# Patient Record
Sex: Male | Born: 1960 | Race: White | Hispanic: No | Marital: Married | Smoking: Never smoker
Health system: Southern US, Community
[De-identification: ages and names within clinical notes are randomized; demographics above are authoritative.]

## PROBLEM LIST (undated history)

## (undated) DIAGNOSIS — M1990 Unspecified osteoarthritis, unspecified site: Secondary | ICD-10-CM

## (undated) DIAGNOSIS — M549 Dorsalgia, unspecified: Secondary | ICD-10-CM

## (undated) DIAGNOSIS — T7840XA Allergy, unspecified, initial encounter: Secondary | ICD-10-CM

## (undated) DIAGNOSIS — K219 Gastro-esophageal reflux disease without esophagitis: Secondary | ICD-10-CM

## (undated) DIAGNOSIS — N200 Calculus of kidney: Secondary | ICD-10-CM

## (undated) HISTORY — DX: Dorsalgia, unspecified: M54.9

## (undated) HISTORY — PX: EYE SURGERY: SHX253

## (undated) HISTORY — DX: Gastro-esophageal reflux disease without esophagitis: K21.9

## (undated) HISTORY — DX: Unspecified osteoarthritis, unspecified site: M19.90

## (undated) HISTORY — DX: Allergy, unspecified, initial encounter: T78.40XA

## (undated) HISTORY — PX: HERNIA REPAIR: SHX51

---

## 2005-07-21 ENCOUNTER — Emergency Department (HOSPITAL_COMMUNITY): Admission: EM | Admit: 2005-07-21 | Discharge: 2005-07-21 | Payer: Self-pay | Admitting: Emergency Medicine

## 2005-12-25 ENCOUNTER — Ambulatory Visit: Payer: Self-pay | Admitting: Cardiology

## 2005-12-25 ENCOUNTER — Inpatient Hospital Stay (HOSPITAL_COMMUNITY): Admission: EM | Admit: 2005-12-25 | Discharge: 2005-12-28 | Payer: Self-pay | Admitting: Emergency Medicine

## 2005-12-27 ENCOUNTER — Ambulatory Visit: Payer: Self-pay | Admitting: Cardiology

## 2006-06-04 ENCOUNTER — Ambulatory Visit (HOSPITAL_COMMUNITY): Admission: RE | Admit: 2006-06-04 | Discharge: 2006-06-04 | Payer: Self-pay | Admitting: Pulmonary Disease

## 2007-12-31 ENCOUNTER — Emergency Department (HOSPITAL_COMMUNITY): Admission: EM | Admit: 2007-12-31 | Discharge: 2007-12-31 | Payer: Self-pay | Admitting: Emergency Medicine

## 2008-01-05 ENCOUNTER — Ambulatory Visit (HOSPITAL_COMMUNITY): Admission: RE | Admit: 2008-01-05 | Discharge: 2008-01-05 | Payer: Self-pay | Admitting: Pulmonary Disease

## 2008-01-07 ENCOUNTER — Encounter: Admission: RE | Admit: 2008-01-07 | Discharge: 2008-01-07 | Payer: Self-pay | Admitting: Pulmonary Disease

## 2008-01-22 ENCOUNTER — Encounter: Admission: RE | Admit: 2008-01-22 | Discharge: 2008-01-22 | Payer: Self-pay | Admitting: Pulmonary Disease

## 2008-04-23 ENCOUNTER — Emergency Department (HOSPITAL_COMMUNITY): Admission: EM | Admit: 2008-04-23 | Discharge: 2008-04-23 | Payer: Self-pay | Admitting: Emergency Medicine

## 2008-05-26 ENCOUNTER — Ambulatory Visit (HOSPITAL_COMMUNITY): Admission: RE | Admit: 2008-05-26 | Discharge: 2008-05-26 | Payer: Self-pay | Admitting: Pulmonary Disease

## 2008-08-04 ENCOUNTER — Encounter: Admission: RE | Admit: 2008-08-04 | Discharge: 2008-08-04 | Payer: Self-pay | Admitting: Pulmonary Disease

## 2008-11-25 ENCOUNTER — Encounter: Admission: RE | Admit: 2008-11-25 | Discharge: 2008-11-25 | Payer: Self-pay | Admitting: Pulmonary Disease

## 2009-04-14 ENCOUNTER — Emergency Department (HOSPITAL_COMMUNITY): Admission: EM | Admit: 2009-04-14 | Discharge: 2009-04-14 | Payer: Self-pay | Admitting: Emergency Medicine

## 2010-07-23 LAB — CBC
MCV: 90.5 fL (ref 78.0–100.0)
RBC: 4.8 MIL/uL (ref 4.22–5.81)
WBC: 8.1 10*3/uL (ref 4.0–10.5)

## 2010-07-23 LAB — DIFFERENTIAL
Basophils Absolute: 0.1 10*3/uL (ref 0.0–0.1)
Lymphs Abs: 1.7 10*3/uL (ref 0.7–4.0)
Monocytes Relative: 7 % (ref 3–12)
Neutro Abs: 5.1 10*3/uL (ref 1.7–7.7)

## 2010-07-23 LAB — BASIC METABOLIC PANEL
BUN: 16 mg/dL (ref 6–23)
CO2: 24 mEq/L (ref 19–32)
Chloride: 106 mEq/L (ref 96–112)
GFR calc Af Amer: >60 mL/min (ref >60)
Potassium: 4.1 mEq/L (ref 3.5–5.1)
Sodium: 141 mEq/L (ref 135–145)
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
## 2010-09-07 NOTE — Procedures (Signed)
NAME:  Eugene Kelley, Eugene Kelley                ACCOUNT NO.:  [id number]   MEDICAL RECORD NO.:  [id number]          PATIENT TYPE:  INP   LOCATION:  A208                          FACILITY:  [facility]   PHYSICIAN:  Gerrit Friends. Dietrich Pates, MD, FACCDATE OF BIRTH:  Mar 21, 1961   DATE OF PROCEDURE:  12/25/2005  DATE OF DISCHARGE:                                  ECHOCARDIOGRAM   CLINICAL DATA:  [age]-year-old gentleman admitted to hospital with  chest pain.  M-mode aorta 3.4, left atrium 3.6, septum 1.1, posterior wall  1.0, LV diastole 4.6, LV systole 3.2.  1. Technically suboptimal but adequate echocardiographic study.  2. Normal left atrial size; right atrium and right ventricle are prominent      but not frankly enlarged; no RVH; normal RV systolic function.  3. Aortic root at the upper limit of normal; mild calcification of the      proximal ascending aorta.  4. Normal mitral, tricuspid, aortic and pulmonic valves; normal proximal      pulmonary artery.  5. Normal Doppler study with physiologic tricuspid regurgitation and      normal estimated RV systolic pressure.  6. Normal internal dimension, wall thickness, regional and global function      of the left ventricle.      Gerrit Friends. Dietrich Pates, MD, [facility]  Electronically Signed     RMR/MEDQ  D:  12/25/2005  T:  12/26/2005  Job:  [id number]

## 2010-09-07 NOTE — Cardiovascular Report (Signed)
NAME:  Eugene Kelley, Eugene Kelley                ACCOUNT NO.:  [id number]   MEDICAL RECORD NO.:  [id number]          PATIENT TYPE:  OUT   LOCATION:  CATH                         FACILITY:  [facility]   PHYSICIAN:  Arturo Morton. 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  He presented with substernal chest pain.  He was admitted.  D-dimer  was within the normal range.  There were no definite EKG changes, and  cardiac enzymes were negative.  A stress test did not reveal significant ST  segment changes, but did suggest anteroseptal ischemia, and he was  subsequently sent down for cardiac catheterization by Dr. Dietrich Pates.  Dr.  Dietrich Pates explained the risks, benefits and alternatives to the patient, and  he was agreeable to proceed.  I followed with the same similar information.  The patient was agreeable.   PROCEDURE:  1. Left heart catheterization.  2. Selective coronary arteriography.  3. Selective left ventriculography.   DESCRIPTION OF PROCEDURE:  The patient was brought to the catheterization  laboratory and prepped and draped in the usual fashion.  Through an anterior  puncture, the right femoral artery was easily entered.  A 6-French sheath  was then placed.  Views of the left and right coronary arteries were  obtained with multiple angiographic projections.  Specifically,   Dictation Ended At [employer].      Arturo Morton. Riley Kill, MD, [facility]  Electronically Signed     TDS/MEDQ  D:  12/27/2005  T:  12/27/2005  Job:  [id number]   cc:   Kirstie Peri, MD  Gerrit Friends. Dietrich Pates, MD, [facility]  Michaelyn Barter, M.D.

## 2010-09-07 NOTE — Cardiovascular Report (Signed)
NAME:  Eugene Kelley, Eugene Kelley                ACCOUNT NO.:  [id number]   MEDICAL RECORD NO.:  [id number]          PATIENT TYPE:  INP   LOCATION:  2807                         FACILITY:  [facility]   PHYSICIAN:  Arturo Morton. Riley Kill, MD, FACCDATE OF BIRTH:  August 11, 1960   DATE OF PROCEDURE:  12/27/2005  DATE OF DISCHARGE:                              CARDIAC CATHETERIZATION   INDICATIONS:  Eugene Kelley is a 50 year old gentleman who presented with chest  pain.  His enzymes were negative.  His EKGs did not show acute change.  Exercise tolerance test was performed with abnormal exercise tolerance and a  perfusion defect.  Dr. Dietrich Pates recommended cardiac catheterization.  He was  sent to the lab for further evaluation.   PROCEDURE:  1. Left heart catheterization.  2. Selective coronary territory.  3. Selective left ventriculography.   DESCRIPTION OF PROCEDURE:  The patient was brought to the catheterization  laboratory and prepped and draped in usual fashion.  Through an anterior  puncture, the right femoral artery was easily entered, and a 6-French sheath  was placed.  Following this, views of the left coronary arteries were  obtained in multiple angiographic projections.  Particular time was taken to  try to lay out the two diagonal branches which had close exit from the LAD.  The right coronary artery had an anterior takeoff.  Central aortic and left  ventricular pressures were measured with a pigtail.  Ventriculography was  performed in the RAO projection.  There were no complications.  The patient  was taken to the holding area in satisfactory clinical condition.   HEMODYNAMIC DATA.:  1. Central aortic pressure 103/60, mean 78.  2. Left ventricular pressure 112/19.  3. No gradient or pullback across the aortic valve.   ANGIOGRAPHIC DATA:  1. Ventriculography was done in the RAO projection.  There was ventricular      ectopy, so ejection fraction could not be formally calculated, but on a      post  PVC beat, there did not appear to be a definite wall motion      abnormality.  2. The left main is free of critical disease.  3. Left anterior descending artery courses to the apex.  There are two      diagonal branches which come off closely.  The first one occupies the      distribution of a marginal branch in several views.  There does not      appear to be significant ostial narrowing.  The second diagonal branch      is a fairly large caliber vessel without critical narrowing.  The      distal LAD courses to the apex and appears to be smooth.  4. There is a ramus intermedius that bifurcates and is without critical      narrowing.  5. The AV circumflex provides a marginal branch and a small distal AV      circumflex, and these appear to be relatively smooth and without      significant narrowing.  6. The right coronary artery has an anterior takeoff and appears smooth  throughout distally with an acute marginal, posterior descending and      posterolateral branch.   CONCLUSION:  1. Well-preserved left ventricular function.  2. No evidence of critical obstruction.   DISPOSITION:  We will recheck the patient's D-dimer.  We will recommend that  he discontinues using skoal.  The exact etiology is unclear, but at the  present time, conservative course of management will be recommended.      Arturo Morton. Riley Kill, MD, [facility]  Electronically Signed     TDS/MEDQ  D:  12/27/2005  T:  12/27/2005  Job:  [id number]   cc:   Gerrit Friends. Dietrich Pates, MD, [facility]  Kirstie Peri, MD  Michaelyn Barter, M.D.

## 2011-01-23 LAB — URINALYSIS, ROUTINE W REFLEX MICROSCOPIC
Nitrite: NEGATIVE
Protein, ur: NEGATIVE
Urobilinogen, UA: 0.2
pH: 6

## 2012-04-18 ENCOUNTER — Emergency Department (HOSPITAL_COMMUNITY)
Admission: EM | Admit: 2012-04-18 | Discharge: 2012-04-18 | Disposition: A | Discharge status: Home / Self Care | Payer: Managed Care, Other (non HMO) | Source: Home / Self Care

## 2012-04-18 ENCOUNTER — Encounter (HOSPITAL_COMMUNITY): Payer: Self-pay | Admitting: Emergency Medicine

## 2012-04-18 DIAGNOSIS — K529 Noninfective gastroenteritis and colitis, unspecified: Secondary | ICD-10-CM

## 2012-04-18 DIAGNOSIS — K5289 Other specified noninfective gastroenteritis and colitis: Secondary | ICD-10-CM

## 2012-04-18 HISTORY — DX: Calculus of kidney: N20.0

## 2012-04-18 NOTE — ED Notes (Signed)
Pt states that he was exposed to the norovirus. Pt has been having n/v/d since Friday morning. Pt tried otc meds and gingerale with no relief.   Pt also states that he would need a note to return to work.

## 2012-04-18 NOTE — ED Notes (Signed)
Waiting discharge papers 

## 2012-04-18 NOTE — ED Provider Notes (Signed)
History     CSN: [id number]  Arrival date & time 04/18/12  1250   None     Chief Complaint  Patient presents with  . URI    pt exposed to norovirus. n/v/d. symptoms started friday morning.    (Consider location/radiation/quality/duration/timing/severity/associated sxs/prior treatment) HPI Comments: The 51 year old male said nausea vomiting diarrhea yesterday beginning at 2:30 AM and ending 6 AM this morning. Was associated with a low-grade fever. Since 6 AM he said no more fever, vomiting, or diarrhea. He now has mild low back pain and mild abdominal discomfort. He states this is getting better as well. He states he called the urgent care 2 receive a work note and he was told he would have to be seen by the medical staff first. In general he is improving and is able to drink and eat without having vomiting or diarrhea.   Past Medical History  Diagnosis Date  . Kidney stones     Past Surgical History  Procedure Date  . Hernia repair     History reviewed. No pertinent family history.  History  Substance Use Topics  . Smoking status: Never Smoker   . Smokeless tobacco: Not on file  . Alcohol Use: No      Review of Systems  Constitutional: Positive for activity change. Negative for fever.  HENT: Negative.   Cardiovascular: Negative.   Gastrointestinal:       As per history of present illness  Genitourinary: Negative.   Musculoskeletal: Negative.     Allergies  Hydrocodone  Home Medications  No current outpatient prescriptions on file.  BP 143/86  Pulse 85  Temp 97.3 F (36.3 C) (Oral)  Resp 20  SpO2 100%  Physical Exam  Nursing note and vitals reviewed. Constitutional: He is oriented to person, place, and time. He appears well-developed and well-nourished. No distress.  Eyes: Conjunctivae normal and EOM are normal.  Neck: Normal range of motion. Neck supple.  Cardiovascular: Normal rate, regular rhythm and normal heart sounds.   Pulmonary/Chest:  Effort normal. No respiratory distress. He has no wheezes.  Abdominal: Soft. There is no tenderness. There is no rebound and no guarding.  Musculoskeletal: Normal range of motion. He exhibits no edema.  Neurological: He is alert and oriented to person, place, and time. He exhibits normal muscle tone.  Skin: Skin is warm and dry. No erythema.  Psychiatric: He has a normal mood and affect.    ED Course  Procedures (including critical care time)  Labs Reviewed - No data to display No results found.   1. Acute gastroenteritis       MDM  Patient symptoms have abated. He is stable on discharge. He was given instructions on diet and diet advancement. Is also given instructions on gastroenteritis. Work note to be out of work for the 27th through the 29th.         Hayden Rasmussen, NP 04/18/12 [phone]

## 2012-04-20 NOTE — ED Provider Notes (Signed)
Medical screening examination/treatment/procedure(s) were performed by resident physician or non-physician practitioner and as supervising physician I was immediately available for consultation/collaboration.   KINDL,JAMES DOUGLAS MD.    James D Kindl, MD 04/20/12 2052 

## 2014-05-03 ENCOUNTER — Other Ambulatory Visit (HOSPITAL_COMMUNITY): Payer: Self-pay | Admitting: Pulmonary Disease

## 2014-05-03 DIAGNOSIS — N200 Calculus of kidney: Secondary | ICD-10-CM

## 2014-05-04 ENCOUNTER — Ambulatory Visit (HOSPITAL_COMMUNITY)
Admission: RE | Admit: 2014-05-04 | Discharge: 2014-05-04 | Disposition: A | Discharge status: Home / Self Care | Payer: BLUE CROSS/BLUE SHIELD | Source: Ambulatory Visit | Attending: Pulmonary Disease | Admitting: Pulmonary Disease

## 2014-05-04 DIAGNOSIS — R310 Gross hematuria: Secondary | ICD-10-CM | POA: Insufficient documentation

## 2014-05-04 DIAGNOSIS — N200 Calculus of kidney: Secondary | ICD-10-CM | POA: Diagnosis not present

## 2014-05-04 DIAGNOSIS — R109 Unspecified abdominal pain: Secondary | ICD-10-CM | POA: Diagnosis not present

## 2017-02-18 DIAGNOSIS — G894 Chronic pain syndrome: Secondary | ICD-10-CM | POA: Diagnosis not present

## 2017-02-18 DIAGNOSIS — M545 Low back pain: Secondary | ICD-10-CM | POA: Diagnosis not present

## 2017-02-18 DIAGNOSIS — M47817 Spondylosis without myelopathy or radiculopathy, lumbosacral region: Secondary | ICD-10-CM | POA: Diagnosis not present

## 2017-03-27 DIAGNOSIS — Z008 Encounter for other general examination: Secondary | ICD-10-CM | POA: Diagnosis not present

## 2017-07-04 DIAGNOSIS — Z008 Encounter for other general examination: Secondary | ICD-10-CM | POA: Diagnosis not present

## 2017-07-04 DIAGNOSIS — F17201 Nicotine dependence, unspecified, in remission: Secondary | ICD-10-CM | POA: Diagnosis not present

## 2017-09-22 DIAGNOSIS — Z008 Encounter for other general examination: Secondary | ICD-10-CM | POA: Diagnosis not present

## 2017-12-29 DIAGNOSIS — Z008 Encounter for other general examination: Secondary | ICD-10-CM | POA: Diagnosis not present

## 2018-03-30 DIAGNOSIS — Z008 Encounter for other general examination: Secondary | ICD-10-CM | POA: Diagnosis not present

## 2018-05-15 DIAGNOSIS — R05 Cough: Secondary | ICD-10-CM | POA: Diagnosis not present

## 2018-05-15 DIAGNOSIS — J209 Acute bronchitis, unspecified: Secondary | ICD-10-CM | POA: Diagnosis not present

## 2018-07-06 DIAGNOSIS — Z008 Encounter for other general examination: Secondary | ICD-10-CM | POA: Diagnosis not present

## 2018-07-20 DIAGNOSIS — M47817 Spondylosis without myelopathy or radiculopathy, lumbosacral region: Secondary | ICD-10-CM | POA: Diagnosis not present

## 2018-07-20 DIAGNOSIS — G894 Chronic pain syndrome: Secondary | ICD-10-CM | POA: Diagnosis not present

## 2018-07-20 DIAGNOSIS — M545 Low back pain: Secondary | ICD-10-CM | POA: Diagnosis not present

## 2018-09-09 DIAGNOSIS — Z125 Encounter for screening for malignant neoplasm of prostate: Secondary | ICD-10-CM | POA: Diagnosis not present

## 2018-09-09 DIAGNOSIS — Z0000 Encounter for general adult medical examination without abnormal findings: Secondary | ICD-10-CM | POA: Diagnosis not present

## 2018-12-14 ENCOUNTER — Encounter: Payer: Self-pay | Admitting: Family Medicine

## 2018-12-14 ENCOUNTER — Other Ambulatory Visit: Payer: Self-pay

## 2018-12-14 ENCOUNTER — Ambulatory Visit (INDEPENDENT_AMBULATORY_CARE_PROVIDER_SITE_OTHER): Payer: BC Managed Care – PPO | Admitting: Family Medicine

## 2018-12-14 VITALS — BP 122/86 | HR 68 | Temp 98.1°F | Ht 71.0 in | Wt 307.6 lb

## 2018-12-14 DIAGNOSIS — R5383 Other fatigue: Secondary | ICD-10-CM | POA: Diagnosis not present

## 2018-12-14 DIAGNOSIS — M549 Dorsalgia, unspecified: Secondary | ICD-10-CM

## 2018-12-14 DIAGNOSIS — K029 Dental caries, unspecified: Secondary | ICD-10-CM | POA: Diagnosis not present

## 2018-12-14 DIAGNOSIS — R0602 Shortness of breath: Secondary | ICD-10-CM | POA: Diagnosis not present

## 2018-12-14 DIAGNOSIS — G8929 Other chronic pain: Secondary | ICD-10-CM | POA: Insufficient documentation

## 2018-12-14 MED ORDER — FAMOTIDINE 20 MG PO TABS: 20.0000 mg | ORAL_TABLET | Freq: Two times a day (BID) | ORAL | 5 refills | Status: DC

## 2018-12-14 NOTE — Patient Instructions (Addendum)
Blood work -Estate manager/land agent for Dispensing optician for teeth removal

## 2018-12-14 NOTE — Progress Notes (Signed)
New Patient Office Visit  Subjective:  Patient ID: Eugene Kelley, male    DOB: 1961-01-02  Age: 58 y.o. MRN: [id number]  CC:  Chief Complaint  Patient presents with  . New Patient (Initial Visit)    HPI XAVIOUS SHARRAR presents for skin infection after spider bite-recurrent infections pt taking PEN V 250mg  qd since the bite to keep from having an infection.  Last outbreak 4 years ago.    Back Pain-takes mefanamic acid and chlorzoxazone for back pain-injections every 18 months at back specialist  Chest pain-evaluated for MI-cardiology clearance patients-2008  Pt has lost 427-295lbs-lost 2000-increase exercist  Pt had mumps -sterile-no kids per pt  Short of breath-walking for exercise-easy to tire. Pt has never smoked. Pt with second hand smoke-previous wife.   MDI used in the wintertime-diagnosed with bronchitis   Bruising noted on the left forearm -moved a refrigerator  Past Medical History:  Diagnosis Date  . Allergy   . Arthritis   . Back pain    chronic  . GERD (gastroesophageal reflux disease)   . Kidney stones     Past Surgical History:  Procedure Laterality Date  . EYE SURGERY    . 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 Marital status: Married    Spouse name: Not on file  . Number of children: Not on file  . Years of education: Not on file  . Highest education level: Not on file  Occupational History  . Not on file  Social Needs  . Financial resource strain: Not on file  . Food insecurity    Worry: Not on file    Inability: Not on file  . Transportation needs    Medical: Not on file    Non-medical: Not on file  Tobacco Use  . Smoking status: Never Smoker  Substance and Sexual Activity  . Alcohol use: No  . Drug use: No  . Sexual activity: Yes    Birth control/protection: Condom  Lifestyle  . Physical activity    Days per week:  Not on file    Minutes per session: Not on file  . Stress: Not on file  Relationships  . Social Herbalist on phone: Not on file    Gets together: Not on file    Attends religious service: Not on file    Active member of club or organization: Not on file    Attends meetings of clubs or organizations: Not on file    Relationship status: Not on file  . Intimate partner violence    Fear of current or ex partner: Not on file    Emotionally abused: Not on file    Physically abused: Not on file    Forced sexual activity: Not on file  Other Topics Concern  . Not on file  Social History Narrative  . Not on file    ROS Review of Systems  Constitutional: Positive for fatigue.       Wellness labwork A1c Cholesterol panel Tob  HENT: Positive for hearing loss.        Pt needs teeth  Eyes: Negative for visual disturbance.       Lasic surgery-readers only  Respiratory: Positive for choking and shortness of breath.        Not many teeth-difficulty chewing Esophagus dilation-gastro Zantac in the past  Cardiovascular: Negative for chest  pain.  Gastrointestinal:       GERD 2017-colonoscopy  Genitourinary:       Kidney stones episodically  PSA 2020-normal  Musculoskeletal: Positive for back pain, joint swelling and myalgias.  Skin: Positive for rash.  Allergic/Immunologic: Positive for environmental allergies.       Claritin  Neurological: Positive for headaches.       Allergies' Sinus infection Migraine -otc-exedrin helps resolve-takes twice a week    Objective:   Today's Vitals: BP 122/86 (BP Location: Left Wrist, Patient Position: Sitting, Cuff Size: Large)   Pulse 68   Temp 98.1 F (36.7 C) (Oral)   Ht 5\' 11"  (1.803 m)   Wt (!) 307 lb 9.6 oz (139.5 kg)   SpO2 95%   BMI 42.90 kg/m   Physical Exam Constitutional:      Appearance: He is obese.  HENT:     Head: Normocephalic and atraumatic.     Right Ear: Tympanic membrane normal.     Left Ear: Tympanic  membrane normal.     Nose: Nose normal.     Mouth/Throat:     Mouth: Mucous membranes are dry.     Comments: Dental caries Eyes:     Conjunctiva/sclera: Conjunctivae normal.  Neck:     Musculoskeletal: Normal range of motion and neck supple.  Cardiovascular:     Rate and Rhythm: Normal rate and regular rhythm.     Pulses: Normal pulses.     Heart sounds: Normal heart sounds.  Pulmonary:     Effort: Pulmonary effort is normal.     Breath sounds: Normal breath sounds.  Abdominal:     General: Abdomen is flat. Bowel sounds are normal.     Palpations: Abdomen is soft.  Musculoskeletal:     Right lower leg: Edema present.     Left lower leg: Edema present.     Comments: Trace   Skin:    Findings: Bruising present.     Comments: Left forearm-ecchymosis  Neurological:     Mental Status: He is oriented to person, place, and time.  Psychiatric:        Mood and Affect: Mood normal.        Behavior: Behavior normal.     Assessment & Plan:   Outpatient Encounter Medications as of 12/14/2018  Medication Sig  . Chlorzoxazone 375 MG TABS Take 375 mg by mouth as needed.  . Mefenamic Acid 250 MG CAPS Take 250 mg by mouth 2 (two) times daily.  . penicillin v potassium (VEETID) 250 MG/5ML solution Take 250 mg by mouth daily.   No facility-administered encounter medications on file as of 12/14/2018.    1. Fatigue, unspecified type MV in the past - CBC; Future - COMPLETE METABOLIC PANEL WITH GFR; Future - TSH; Future  2. Chronic back pain, unspecified back location, unspecified back pain laterality Injections q 18 months NSAID and muscle relaxers prn  3. Short of breath on exertion Diagnosed with bronchitis by Dr. Elmer PickerHawkings-used MDI in the past-no tob use -second hand smoking Follow-up: No follow-ups on file.   4. Dental caries  pt to see a dentist for extraction Seham [place] Mat CarneLEIGH Ferrell Claiborne, MD

## 2018-12-21 ENCOUNTER — Telehealth: Payer: Self-pay | Admitting: Pulmonary Disease

## 2018-12-21 NOTE — Telephone Encounter (Signed)
Lab order has been faxed to [facility] 

## 2018-12-21 NOTE — Telephone Encounter (Signed)
Patient called and states he went to get his labs done this morning and [facility] says they are not seeing any lab orders. Please advise and let patient know when they can go and get them.

## 2019-01-08 ENCOUNTER — Other Ambulatory Visit: Payer: Self-pay | Admitting: Family Medicine

## 2019-01-08 DIAGNOSIS — R5383 Other fatigue: Secondary | ICD-10-CM | POA: Diagnosis not present

## 2019-01-09 LAB — COMPLETE METABOLIC PANEL WITH GFR
AG Ratio: 1.2 (calc) (ref 1.0–2.5)
ALT: 35 U/L (ref 9–46)
AST: 32 U/L (ref 10–35)
Albumin: 3.8 g/dL (ref 3.6–5.1)
Alkaline phosphatase (APISO): 59 U/L (ref 35–144)
BUN: 17 mg/dL (ref 7–25)
CO2: 26 mmol/L (ref 20–32)
Calcium: 9.2 mg/dL (ref 8.6–10.3)
Chloride: 104 mmol/L (ref 98–110)
Creat: 0.7 mg/dL (ref 0.70–1.33)
GFR, Est African American: 121 mL/min/{1.73_m2} (ref >=60)
GFR, Est Non African American: 104 mL/min/{1.73_m2} (ref >=60)
Globulin: 3.2 g/dL (calc) (ref 1.9–3.7)
Glucose, Bld: 100 mg/dL — ABNORMAL HIGH (ref 65–99)
Potassium: 5.3 mmol/L (ref 3.5–5.3)
Sodium: 140 mmol/L (ref 135–146)
Total Bilirubin: 0.4 mg/dL (ref 0.2–1.2)
Total Protein: 7 g/dL (ref 6.1–8.1)

## 2019-01-09 LAB — CBC
HCT: 42.1 % (ref 38.5–50.0)
Hemoglobin: 14 g/dL (ref 13.2–17.1)
MCH: 30 pg (ref 27.0–33.0)
MCHC: 33.3 g/dL (ref 32.0–36.0)
MCV: 90.1 fL (ref 80.0–100.0)
MPV: 11 fL (ref 7.5–12.5)
Platelets: 307 10*3/uL (ref 140–400)
RBC: 4.67 10*6/uL (ref 4.20–5.80)
RDW: 12.5 % (ref 11.0–15.0)
WBC: 6.5 10*3/uL (ref 3.8–10.8)

## 2019-01-09 LAB — TSH: TSH: 5.48 mIU/L — ABNORMAL HIGH (ref 0.40–4.50)

## 2019-02-01 ENCOUNTER — Encounter: Payer: Self-pay | Admitting: Family Medicine

## 2019-02-01 ENCOUNTER — Ambulatory Visit: Payer: BC Managed Care – PPO | Admitting: Family Medicine

## 2019-02-01 ENCOUNTER — Other Ambulatory Visit: Payer: Self-pay

## 2019-02-01 VITALS — BP 107/68 | HR 81 | Temp 97.8°F | Ht 71.0 in | Wt 307.4 lb

## 2019-02-01 DIAGNOSIS — E039 Hypothyroidism, unspecified: Secondary | ICD-10-CM

## 2019-02-01 DIAGNOSIS — R0602 Shortness of breath: Secondary | ICD-10-CM | POA: Diagnosis not present

## 2019-02-01 DIAGNOSIS — Z23 Encounter for immunization: Secondary | ICD-10-CM

## 2019-02-01 MED ORDER — LEVOTHYROXINE SODIUM 50 MCG PO TABS: 50.0000 ug | ORAL_TABLET | Freq: Every day | ORAL | 1 refills | Status: DC

## 2019-02-01 NOTE — Progress Notes (Signed)
Established Patient Office Visit  Subjective:  Patient ID: Eugene Kelley, male    DOB: 1961-02-27  Age: 58 y.o. MRN: [id number]  CC:TSH elevated  HPI [name] presents for acute on chronic back pain.  Pt states moved equipment in dad's shop-using ice and topical creme-improved today No loss of bowel function, no numbness in legs or feet. Pt with concern for DOE-request cardio evaluation. Pt worried about blockages due to DOE. No recent cardiac evaluation. Pt with no h/o of MI/CHF/CVA. Pt with no prior h/o thyroid problems  Past Medical History:  Diagnosis Date  . Allergy   . Arthritis   . Back pain    chronic  . GERD (gastroesophageal reflux disease)   . Kidney stones     Past Surgical History:  Procedure Laterality Date  . EYE SURGERY    . HERNIA REPAIR      Family History  Problem Relation Age of Onset  . Heart disease Mother   . Hyperlipidemia Mother   . Hypertension Mother   . Cancer Father   . Heart disease Father   . Hyperlipidemia Father   . Hypertension Father   . Stroke Father   . Diabetes Sister   . Heart disease Sister   . Hyperlipidemia Sister   . Hypertension Sister   . Hyperlipidemia Brother   . Diabetes Brother   . Heart disease Maternal Grandmother   . Hyperlipidemia Maternal Grandmother   . Cancer Maternal Grandfather   . Heart disease Maternal Grandfather   . Hyperlipidemia Maternal Grandfather   . Hypertension Maternal Grandfather   . Heart disease Paternal Grandmother   . Hyperlipidemia Paternal Grandmother   . Hypertension Paternal Grandmother   . Cancer Paternal Grandfather     Social History   Socioeconomic History  . Marital status: Married    Spouse name: Not on file  . Number of children: Not on file  . Years of education: Not on file  . Highest education level: Not on file  Occupational History  . Occupation: truck Education administrator: [employer]  . Financial resource strain: Not on file  . Food  insecurity    Worry: Not on file    Inability: Not on file  . Transportation needs    Medical: Not on file    Non-medical: Not on file  Tobacco Use  . Smoking status: Never Smoker  Substance and Sexual Activity  . Alcohol use: No  . Drug use: No  . Sexual activity: Yes    Birth control/protection: Condom  Lifestyle  . Physical activity    Days per week: Not on file    Minutes per session: Not on file  . Stress: Not on file  Relationships  . Social Herbalist on phone: Not on file    Gets together: Not on file    Attends religious service: Not on file    Active member of club or organization: Not on file    Attends meetings of clubs or organizations: Not on file    Relationship status: Not on file  . Intimate partner violence    Fear of current or ex partner: Not on file    Emotionally abused: Not on file    Physically abused: Not on file    Forced sexual activity: Not on file  Other Topics Concern  . Not on file  Social History Narrative  . Not on file    Outpatient  Medications Prior to Visit  Medication Sig Dispense Refill  . Chlorzoxazone 375 MG TABS Take 375 mg by mouth as needed.    . famotidine (PEPCID) 20 MG tablet Take 1 tablet (20 mg total) by mouth 2 (two) times daily. 60 tablet 5  . Mefenamic Acid 250 MG CAPS Take 250 mg by mouth 2 (two) times daily.    . penicillin v potassium (VEETID) 250 MG/5ML solution Take 250 mg by mouth daily.     No facility-administered medications prior to visit.     Allergies  Allergen Reactions  . Hydrocodone     ROS Review of Systems  Constitutional: Positive for fatigue.  Musculoskeletal: Positive for back pain.      Objective:    Physical Exam  BP 107/68 (BP Location: Left Arm, Patient Position: Sitting, Cuff Size: Large)   Pulse 81   Temp 97.8 F (36.6 C) (Oral)   Ht 5\' 11"  (1.803 m)   Wt (!) 307 lb 6.4 oz (139.4 kg)   SpO2 94%   BMI 42.87 kg/m  Wt Readings from Last 3 Encounters:  02/01/19  (!) 307 lb 6.4 oz (139.4 kg)  12/14/18 (!) 307 lb 9.6 oz (139.5 kg)   50% of visit completed counseling concerning hypothyroid, medication for treatment.Reviewed labwork.   Health Maintenance Due  Topic Date Due  . Hepatitis C Screening  Sep 18, 1960  . HIV Screening  03/08/1975  . TETANUS/TDAP  03/08/1979  . COLONOSCOPY  03/07/2010  . INFLUENZA VACCINE  11/21/2018    There are no preventive care reminders to display for this patient.  Lab Results  Component Value Date   TSH 5.48 (H) 01/08/2019   Lab Results  Component Value Date   WBC 6.5 01/08/2019   HGB 14.0 01/08/2019   HCT 42.1 01/08/2019   MCV 90.1 01/08/2019   PLT 307 01/08/2019   Lab Results  Component Value Date   NA 140 01/08/2019   K 5.3 01/08/2019   CO2 26 01/08/2019   GLUCOSE 100 (H) 01/08/2019   BUN 17 01/08/2019   CREATININE 0.70 01/08/2019   BILITOT 0.4 01/08/2019   AST 32 01/08/2019   ALT 35 01/08/2019   PROT 7.0 01/08/2019   CALCIUM 9.2 01/08/2019    Assessment & Plan:   1. Hypothyroidism, unspecified type synthroid - TSH  2. Short of breath on exertion - Ambulatory referral to Cardiology Follow-up: 6 weeks f/u for TSH  Avannah Decker 01/10/2019, MD

## 2019-02-01 NOTE — Patient Instructions (Addendum)
Hypothyroidism  Hypothyroidism is when the thyroid gland does not make enough of certain hormones (it is underactive). The thyroid gland is a small gland located in the lower front part of the neck, just in front of the windpipe (trachea). This gland makes hormones that help control how the body uses food for energy (metabolism) as well as how the heart and brain function. These hormones also play a role in keeping your bones strong. When the thyroid is underactive, it produces too little of the hormones thyroxine (T4) and triiodothyronine (T3). What are the causes? This condition may be caused by:  Hashimoto's disease. This is a disease in which the body's disease-fighting system (immune system) attacks the thyroid gland. This is the most common cause.  Viral infections.  Pregnancy.  Certain medicines.  Birth defects.  Past radiation treatments to the head or neck for cancer.  Past treatment with radioactive iodine.  Past exposure to radiation in the environment.  Past surgical removal of part or all of the thyroid.  Problems with a gland in the center of the brain (pituitary gland).  Lack of enough iodine in the diet. What increases the risk? You are more likely to develop this condition if:  You are male.  You have a family history of thyroid conditions.  You use a medicine called lithium.  You take medicines that affect the immune system (immunosuppressants). What are the signs or symptoms? Symptoms of this condition include:  Feeling as though you have no energy (lethargy).  Not being able to tolerate cold.  Weight gain that is not explained by a change in diet or exercise habits.  Lack of appetite.  Dry skin.  Coarse hair.  Menstrual irregularity.  Slowing of thought processes.  Constipation.  Sadness or depression. How is this diagnosed? This condition may be diagnosed based on:  Your symptoms, your medical history, and a physical exam.  Blood  tests. You may also have imaging tests, such as an ultrasound or MRI. How is this treated? This condition is treated with medicine that replaces the thyroid hormones that your body does not make. After you begin treatment, it may take several weeks for symptoms to go away. Follow these instructions at home:  Take over-the-counter and prescription medicines only as told by your health care provider.  If you start taking any new medicines, tell your health care provider.  Keep all follow-up visits as told by your health care provider. This is important. ? As your condition improves, your dosage of thyroid hormone medicine may change. ? You will need to have blood tests regularly so that your health care provider can monitor your condition. Contact a health care provider if:  Your symptoms do not get better with treatment.  You are taking thyroid replacement medicine and you: ? Sweat a lot. ? Have tremors. ? Feel anxious. ? Lose weight rapidly. ? Cannot tolerate heat. ? Have emotional swings. ? Have diarrhea. ? Feel weak. Get help right away if you have:  Chest pain.  An irregular heartbeat.  A rapid heartbeat.  Difficulty breathing. Summary  Hypothyroidism is when the thyroid gland does not make enough of certain hormones (it is underactive).  When the thyroid is underactive, it produces too little of the hormones thyroxine (T4) and triiodothyronine (T3).  The most common cause is Hashimoto's disease, a disease in which the body's disease-fighting system (immune system) attacks the thyroid gland. The condition can also be caused by viral infections, medicine, pregnancy, or past   radiation treatment to the head or neck.  Symptoms may include weight gain, dry skin, constipation, feeling as though you do not have energy, and not being able to tolerate cold.  This condition is treated with medicine to replace the thyroid hormones that your body does not make. This information  is not intended to replace advice given to you by your health care provider. Make sure you discuss any questions you have with your health care provider. Document Released: 04/08/2005 Document Revised: 03/21/2017 Document Reviewed: 03/19/2017 Elsevier Patient Education  [employer].  Have blood level drawn in 6 weeks for thyroid

## 2019-02-24 NOTE — Progress Notes (Signed)
Cardiology Office Note   Date:  02/25/2019   ID:  Eugene Kelley, DOB Feb 04, 1961, MRN [id number]  PCP:  Maryruth Hancock, MD  Cardiologist:   Dorris Carnes, MD   Pt referrred by Dr Holly Bodily for shortnes of breath, cheset pain    History of Present Illness: Eugene Kelley is a 58 y.o. male who was referred for evaluation of shortness of breath chest discomfort  The patient has no known coronary artery disease.  In 2007 he had an episode of chest pain underwent a stress Myoview.  This showed an area concerning for anteroseptal, anteroapical ischemia.  He went on to have a left heart catheterization which showed normal coronary arteries.  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  He does note that he gets short of breath with activity.  Thinks it was worse than when he was younger.  He mentioned this to Dr. Holly Bodily and was referred here.  Family history significant for atherosclerosis and father     Current Meds  Medication Sig  . Chlorzoxazone 375 MG TABS Take 375 mg by mouth as needed.  . famotidine (PEPCID) 20 MG tablet Take 1 tablet (20 mg total) by mouth 2 (two) times daily.  Marland Kitchen levothyroxine (SYNTHROID) 50 MCG tablet Take 1 tablet (50 mcg total) by mouth daily.  . Mefenamic Acid 250 MG CAPS Take 250 mg by mouth 2 (two) times daily.  . penicillin v potassium (VEETID) 250 MG/5ML solution Take 250 mg by mouth daily.     Allergies:   Hydrocodone   Past Medical History:  Diagnosis Date  . Allergy   . Arthritis   . Back pain    chronic  . GERD (gastroesophageal reflux disease)   . Kidney stones     Past Surgical History:  Procedure Laterality Date  . EYE SURGERY    . HERNIA REPAIR       Social History:  The patient  reports that he has never smoked. He has never used smokeless tobacco. He reports that he does not drink alcohol or use drugs.   Family History:  The patient's family history includes Cancer in his father, maternal  grandfather, and paternal grandfather; Diabetes in his brother and sister; Heart disease in his father, maternal grandfather, maternal grandmother, mother, paternal grandmother, and sister; Hyperlipidemia in his brother, father, maternal grandfather, maternal grandmother, mother, paternal grandmother, and sister; Hypertension in his father, maternal grandfather, mother, paternal grandmother, and sister; Stroke in his father.    ROS:  Please see the history of present illness. All other systems are reviewed and  Negative to the above problem except as noted.    PHYSICAL EXAM: VS:  BP (!) 142/78   Pulse 62   Temp 98.2 F (36.8 C)   Ht 5\' 11"  (1.803 m)   Wt (!) 310 lb 9.6 oz (140.9 kg)   SpO2 98%   BMI 43.32 kg/m   GEN: Morbidly obese 58 year old in no acute distress  HEENT: normal  Neck: no JVD, carotid bruits, or masses Cardiac: RRR; no murmurs, rubs, or gallops,no edema  Respiratory:  clear to auscultation bilaterally, normal work of breathing GI: soft, nontender, nondistended, + BS  No hepatomegaly  MS: no deformity Moving all extremities   Skin: warm and dry, no rash Neuro:  Strength and sensation are intact Psych: euthymic mood, full affect   EKG:  EKG is not ordered today.   Lipid Panel No results  found for: CHOL, TRIG, HDL, CHOLHDL, VLDL, LDLCALC, LDLDIRECT    Wt Readings from Last 3 Encounters:  02/25/19 (!) 310 lb 9.6 oz (140.9 kg)  02/01/19 (!) 307 lb 6.4 oz (139.4 kg)  12/14/18 (!) 307 lb 9.6 oz (139.5 kg)      ASSESSMENT AND PLAN:  1.  Dyspnea.  I am not convinced this is cardiac.  He is morbidly obese but says his symptoms have gotten more pronounced I will therefore set up for an echocardiogram to evaluate LV systolic and diastolic properties.  No evidence of volume overload on exam.  I am not convinced this is an anginal equivalent given normal cath years ago.  Would follow.  2.  Blood pressure will need to be followed  3.  Obesity, morbid.  Discussed  exercise and diet.  Follow-up will be based on test results.  Current medicines are reviewed at length with the patient today.  The patient does not have concerns regarding medicines.  Signed, Dietrich Pates, MD  02/25/2019 9:39 AM    [facility] [address] [place], [place], [place]  [address] Phone: [phone]; Fax: [phone]

## 2019-02-25 ENCOUNTER — Encounter: Payer: Self-pay | Admitting: Internal Medicine

## 2019-02-25 ENCOUNTER — Ambulatory Visit (HOSPITAL_COMMUNITY)
Admission: RE | Admit: 2019-02-25 | Discharge: 2019-02-25 | Disposition: A | Discharge status: Home / Self Care | Payer: BC Managed Care – PPO | Source: Ambulatory Visit | Attending: Internal Medicine | Admitting: Internal Medicine

## 2019-02-25 ENCOUNTER — Other Ambulatory Visit: Payer: Self-pay

## 2019-02-25 ENCOUNTER — Ambulatory Visit: Payer: BC Managed Care – PPO | Admitting: Internal Medicine

## 2019-02-25 VITALS — BP 142/78 | HR 62 | Temp 98.2°F | Ht 71.0 in | Wt 310.6 lb

## 2019-02-25 DIAGNOSIS — R0602 Shortness of breath: Secondary | ICD-10-CM | POA: Diagnosis not present

## 2019-02-25 LAB — ECHOCARDIOGRAM COMPLETE
Height: 71 in
Weight: 4969.6 oz

## 2019-02-25 NOTE — Patient Instructions (Addendum)
   Medication Instructions:  Your physician recommends that you continue on your current medications as directed. Please refer to the Current Medication list given to you today.  *If you need a refill on your cardiac medications before your next appointment, please call your pharmacy*  Lab Work: NONE  If you have labs (blood work) drawn today and your tests are completely normal, you will receive your results only by: Marland Kitchen MyChart Message (if you have MyChart) OR . A paper copy in the mail If you have any lab test that is abnormal or we need to change your treatment, we will call you to review the results.  Testing/Procedures: Your physician has requested that you have an echocardiogram. Echocardiography is a painless test that uses sound waves to create images of your heart. It provides your doctor with information about the size and shape of your heart and how well your heart's chambers and valves are working. This procedure takes approximately one hour. There are no restrictions for this procedure.    Follow-Up: At [facility], you and your health needs are our priority.  As part of our continuing mission to provide you with exceptional heart care, we have created designated Provider Care Teams.  These Care Teams include your primary Cardiologist (physician) and Advanced Practice Providers (APPs -  Physician Assistants and Nurse Practitioners) who all work together to provide you with the care you need, when you need it.  Your next appointment:   To Be Determined by test results   The format for your next appointment:   Either In Person or Virtual  Provider:   Dorris Carnes, MD  Other Instructions Thank you for choosing [facility]!

## 2019-02-25 NOTE — Progress Notes (Signed)
*  PRELIMINARY RESULTS* Echocardiogram 2D Echocardiogram has been performed.  Eugene Kelley 02/25/2019, 10:45 AM

## 2019-03-03 ENCOUNTER — Telehealth: Payer: Self-pay | Admitting: *Deleted

## 2019-03-03 DIAGNOSIS — Z79899 Other long term (current) drug therapy: Secondary | ICD-10-CM

## 2019-03-03 MED ORDER — AMLODIPINE BESYLATE 2.5 MG PO TABS: 2.5000 mg | ORAL_TABLET | Freq: Every day | ORAL | 3 refills | Status: AC

## 2019-03-03 NOTE — Telephone Encounter (Signed)
-----   Message from Paula Ross V, MD sent at 02/26/2019 10:37 AM EST ----- Echo shows heart function is normal pumping function, normal valve function Hx of shortness of breath with exertion, no chest pain   Had in 2007 but worse NOrmal cath in 2007  I would recomm trial of medical therapy   1.  Aggressive control of risk factors   WOuld check lipids 2  Would recomm amlodipine 2.5 mg   Help with BP and vasodilator 3  F/U in clinic in 1 month 

## 2019-03-03 NOTE — Telephone Encounter (Signed)
-----   Message from Fay Records, MD sent at 02/26/2019 10:37 AM EST ----- Echo shows heart function is normal pumping function, normal valve function Hx of shortness of breath with exertion, no chest pain   Had in 2007 but worse NOrmal cath in 2007  I would recomm trial of medical therapy   1.  Aggressive control of risk factors   WOuld check lipids 2  Would recomm amlodipine 2.5 mg   Help with BP and vasodilator 3  F/U in clinic in 1 month

## 2019-03-04 ENCOUNTER — Other Ambulatory Visit: Payer: Self-pay | Admitting: Family Medicine

## 2019-03-08 ENCOUNTER — Other Ambulatory Visit (HOSPITAL_COMMUNITY)
Admission: RE | Admit: 2019-03-08 | Discharge: 2019-03-08 | Disposition: A | Discharge status: Home / Self Care | Payer: BC Managed Care – PPO | Source: Ambulatory Visit | Attending: Internal Medicine | Admitting: Internal Medicine

## 2019-03-08 DIAGNOSIS — Z79899 Other long term (current) drug therapy: Secondary | ICD-10-CM | POA: Insufficient documentation

## 2019-03-08 LAB — LIPID PANEL
Cholesterol: 151 mg/dL (ref 0–200)
HDL: 43 mg/dL (ref >40)
LDL Cholesterol: 91 mg/dL (ref 0–99)
Total CHOL/HDL Ratio: 3.5 RATIO
Triglycerides: 84 mg/dL (ref <150)
VLDL: 17 mg/dL (ref 0–40)

## 2019-04-11 NOTE — Progress Notes (Signed)
Cardiology Office Note   Date:  04/12/2019   ID:  Eugene Kelley, DOB [birth year], MRN [id number]  PCP:  Maryruth Hancock, MD  Cardiologist:   Dorris Carnes, MD   Pt presents for f/u of HTN and SOB    History of Present Illness: Eugene Kelley is a 58 y.o. male who was referred for evaluation of shortness of breath chest discomfort  The patient has no known coronary artery disease.  In 2007 he had an episode of chest pain underwent a stress Myoview.  This showed an area concerning for anteroseptal, anteroapical ischemia.  He went on to have a left heart catheterization which showed normal coronary arteries.   I saw the pt back on 11.5.20 Echo done  Showed Nomal LV functoin  Normal valve function  Recomm adding amlodpine 2.5 to regimen   Following lpids   Lpid panel in November LDL 91  HDL 43     Since seen, the patient says he has been feeling good.  He denies chest pain.  His breathing is been good.  He had an echocardiogram done in November that was normal.  I placed him on amlodipine his blood pressure was little bit up 2.5 mg.    Current Meds  Medication Sig  . amLODipine (NORVASC) 2.5 MG tablet Take 1 tablet (2.5 mg total) by mouth daily.  . Chlorzoxazone 375 MG TABS Take 375 mg by mouth as needed.  . famotidine (PEPCID) 20 MG tablet Take 1 tablet (20 mg total) by mouth 2 (two) times daily.  Marland Kitchen levothyroxine (SYNTHROID) 50 MCG tablet TAKE ONE TABLET BY MOUTH ONCE DAILY.  Marland Kitchen Mefenamic Acid 250 MG CAPS Take 250 mg by mouth 2 (two) times daily.  . penicillin v potassium (VEETID) 250 MG/5ML solution Take 250 mg by mouth daily.     Allergies:   Hydrocodone   Past Medical History:  Diagnosis Date  . Allergy   . Arthritis   . Back pain    chronic  . GERD (gastroesophageal reflux disease)   . Kidney stones     Past Surgical History:  Procedure Laterality Date  . EYE SURGERY    . HERNIA REPAIR       Social History:  The patient  reports that he has never smoked. He  has never used smokeless tobacco. He reports that he does not drink alcohol or use drugs.   Family History:  The patient's family history includes Cancer in his father, maternal grandfather, and paternal grandfather; Diabetes in his brother and sister; Heart disease in his father, maternal grandfather, maternal grandmother, mother, paternal grandmother, and sister; Hyperlipidemia in his brother, father, maternal grandfather, maternal grandmother, mother, paternal grandmother, and sister; Hypertension in his father, maternal grandfather, mother, paternal grandmother, and sister; Stroke in his father.    ROS:  Please see the history of present illness. All other systems are reviewed and  Negative to the above problem except as noted.    PHYSICAL EXAM: VS:  BP 136/82   Pulse 74   Temp (!) 97.5 F (36.4 C)   Ht 5\' 11"  (1.803 m)   Wt (!) 306 lb (138.8 kg)   SpO2 96%   BMI 42.68 kg/m   GEN: Morbidly obese 58 year old in no acute distress  HEENT: normal  Neck: no JVD Cardiac: RRR; no murmurs, rubs, or gallops,no edema  Respiratory:  clear to auscultation bilaterally, normal work of breathing GI: soft, obese MS: no deformity Moving all extremities   Skin:  warm and dry, no rash Neuro: No gross abnormalities Psych: euthymic mood, full affect   EKG:  EKG is not ordered today.   Lipid Panel    Component Value Date/Time   CHOL 151 03/08/2019 0814   TRIG 84 03/08/2019 0814   HDL 43 03/08/2019 0814   CHOLHDL 3.5 03/08/2019 0814   VLDL 17 03/08/2019 0814   LDLCALC 91 03/08/2019 0814      Wt Readings from Last 3 Encounters:  04/12/19 (!) 306 lb (138.8 kg)  02/25/19 (!) 310 lb 9.6 oz (140.9 kg)  02/01/19 (!) 307 lb 6.4 oz (139.4 kg)      ASSESSMENT AND PLAN:  1.  Dyspnea.  Patient denies dyspnea.  Echo in November looked good.  Normal cath in the past.  I would keep him on the amlodipine since he is feeling so good also strongly encouraged him to lose weight.  He has done this  actually lost 143 pounds in the past told him I think he can do it again.   2.  Blood pressure will need to be followed his blood pressure is 136 today.  Again I think with weight loss will come down.  I would keep him on the 2.5 of amlodipine  3.  Obesity, morbid.  Discussed exercise and diet.  4  Lipids   Controlled    Follow-up as needed.  He should follow-up with Dr. Judee Clara has done it in the past (lost weight) think he can do it again.  Follow-up will be based on test results.  Current medicines are reviewed at length with the patient today.  The patient does not have concerns regarding medicines.  Signed, Dietrich Pates, MD  04/12/2019 9:19 AM    [facility] [address] [place], [place], [place]  [address] Phone: [phone]; Fax: [phone]

## 2019-04-12 ENCOUNTER — Ambulatory Visit: Payer: BC Managed Care – PPO | Admitting: Internal Medicine

## 2019-04-12 ENCOUNTER — Other Ambulatory Visit: Payer: Self-pay

## 2019-04-12 ENCOUNTER — Encounter: Payer: Self-pay | Admitting: Internal Medicine

## 2019-04-12 VITALS — BP 136/82 | HR 74 | Temp 97.5°F | Ht 71.0 in | Wt 306.0 lb

## 2019-04-12 DIAGNOSIS — I10 Essential (primary) hypertension: Secondary | ICD-10-CM | POA: Diagnosis not present

## 2019-04-12 DIAGNOSIS — R0600 Dyspnea, unspecified: Secondary | ICD-10-CM | POA: Diagnosis not present

## 2019-04-12 NOTE — Patient Instructions (Signed)
Medication Instructions:  Your physician recommends that you continue on your current medications as directed. Please refer to the Current Medication list given to you today.  *If you need a refill on your cardiac medications before your next appointment, please call your pharmacy*  Lab Work: NONE  If you have labs (blood work) drawn today and your tests are completely normal, you will receive your results only by: Marland Kitchen MyChart Message (if you have MyChart) OR . A paper copy in the mail If you have any lab test that is abnormal or we need to change your treatment, we will call you to review the results.  Testing/Procedures: NONE   Follow-Up: At [facility], you and your health needs are our priority.  As part of our continuing mission to provide you with exceptional heart care, we have created designated Provider Care Teams.  These Care Teams include your primary Cardiologist (physician) and Advanced Practice Providers (APPs -  Physician Assistants and Nurse Practitioners) who all work together to provide you with the care you need, when you need it.  Your next appointment:    As Needed   The format for your next appointment:   In Person  Provider:   Dorris Carnes, MD  Other Instructions Thank you for choosing [facility]!

## 2019-05-03 ENCOUNTER — Other Ambulatory Visit: Payer: Self-pay | Admitting: Family Medicine

## 2019-06-05 ENCOUNTER — Other Ambulatory Visit: Payer: Self-pay | Admitting: Family Medicine

## 2019-07-05 ENCOUNTER — Other Ambulatory Visit: Payer: Self-pay | Admitting: Family Medicine

## 2019-07-12 DIAGNOSIS — E039 Hypothyroidism, unspecified: Secondary | ICD-10-CM | POA: Diagnosis not present

## 2019-07-12 LAB — TSH: TSH: 1.22 mIU/L (ref 0.40–4.50)

## 2019-07-13 ENCOUNTER — Telehealth: Payer: Self-pay | Admitting: Emergency Medicine

## 2019-07-13 NOTE — Telephone Encounter (Signed)
-----   Message from Wandra Feinstein, MD sent at 07/12/2019  4:42 PM EDT ----- Loney Laurence looks great!-continue same dose of thyroid medication

## 2019-07-13 NOTE — Telephone Encounter (Signed)
Left a msg on machine with the below msg about lab results

## 2019-07-16 ENCOUNTER — Ambulatory Visit: Payer: Self-pay | Attending: Internal Medicine

## 2019-07-16 ENCOUNTER — Ambulatory Visit: Payer: BC Managed Care – PPO

## 2019-07-16 DIAGNOSIS — Z23 Encounter for immunization: Secondary | ICD-10-CM

## 2019-07-16 NOTE — Progress Notes (Signed)
   Covid-19 Vaccination Clinic  Name:  Eugene Kelley    MRN: [id number] DOB: [birth year]  07/16/2019  [name] was observed post Covid-19 immunization for 15 minutes without incident. He was provided with Vaccine Information Sheet and instruction to access the V-Safe system.   [name] was instructed to call 911 with any severe reactions post vaccine: Marland Kitchen Difficulty breathing  . Swelling of face and throat  . A fast heartbeat  . A bad rash all over body  . Dizziness and weakness   Immunizations Administered    Name Date Dose VIS Date Route   Moderna COVID-19 Vaccine 07/16/2019 12:07 PM 0.5 mL 03/23/2019 Intramuscular   Manufacturer: Moderna   Lot: [id number]   NDC: 28786-767-20

## 2019-08-02 ENCOUNTER — Ambulatory Visit: Payer: BC Managed Care – PPO | Admitting: Family Medicine

## 2019-08-02 DIAGNOSIS — M47817 Spondylosis without myelopathy or radiculopathy, lumbosacral region: Secondary | ICD-10-CM | POA: Diagnosis not present

## 2019-08-02 DIAGNOSIS — K219 Gastro-esophageal reflux disease without esophagitis: Secondary | ICD-10-CM | POA: Diagnosis not present

## 2019-08-02 DIAGNOSIS — G894 Chronic pain syndrome: Secondary | ICD-10-CM | POA: Diagnosis not present

## 2019-08-02 DIAGNOSIS — Z0189 Encounter for other specified special examinations: Secondary | ICD-10-CM | POA: Diagnosis not present

## 2019-08-02 DIAGNOSIS — M545 Low back pain: Secondary | ICD-10-CM | POA: Diagnosis not present

## 2019-08-02 DIAGNOSIS — I10 Essential (primary) hypertension: Secondary | ICD-10-CM | POA: Diagnosis not present

## 2019-08-02 DIAGNOSIS — E039 Hypothyroidism, unspecified: Secondary | ICD-10-CM | POA: Diagnosis not present

## 2019-08-14 ENCOUNTER — Other Ambulatory Visit: Payer: Self-pay | Admitting: Family Medicine

## 2019-08-18 ENCOUNTER — Ambulatory Visit: Payer: Self-pay | Attending: Internal Medicine

## 2019-08-18 DIAGNOSIS — Z23 Encounter for immunization: Secondary | ICD-10-CM

## 2019-08-18 NOTE — Progress Notes (Signed)
   Covid-19 Vaccination Clinic  Name:  Eugene Kelley    MRN: [id number] DOB: 08-23-1960  08/18/2019  [name] was observed post Covid-19 immunization for 15 minutes without incident. He was provided with Vaccine Information Sheet and instruction to access the V-Safe system.   [name] was instructed to call 911 with any severe reactions post vaccine: Marland Kitchen Difficulty breathing  . Swelling of face and throat  . A fast heartbeat  . A bad rash all over body  . Dizziness and weakness   Immunizations Administered    Name Date Dose VIS Date Route   Moderna COVID-19 Vaccine 08/18/2019  8:42 AM 0.5 mL 03/2019 Intramuscular   Manufacturer: Moderna   Lot: [id number]   NDC: 87681-157-26

## 2019-11-08 DIAGNOSIS — E039 Hypothyroidism, unspecified: Secondary | ICD-10-CM | POA: Diagnosis not present

## 2019-11-08 DIAGNOSIS — Z125 Encounter for screening for malignant neoplasm of prostate: Secondary | ICD-10-CM | POA: Diagnosis not present

## 2019-11-08 DIAGNOSIS — I10 Essential (primary) hypertension: Secondary | ICD-10-CM | POA: Diagnosis not present

## 2019-11-08 DIAGNOSIS — Z1329 Encounter for screening for other suspected endocrine disorder: Secondary | ICD-10-CM | POA: Diagnosis not present

## 2019-11-22 DIAGNOSIS — Z0001 Encounter for general adult medical examination with abnormal findings: Secondary | ICD-10-CM | POA: Diagnosis not present

## 2019-12-17 ENCOUNTER — Telehealth: Payer: Self-pay | Admitting: Physical Medicine and Rehabilitation

## 2019-12-17 ENCOUNTER — Telehealth: Payer: Self-pay | Admitting: Orthopaedic Surgery

## 2019-12-17 NOTE — Telephone Encounter (Signed)
Made in error

## 2019-12-17 NOTE — Telephone Encounter (Signed)
Called patient to advise that we have not received a referral and that he should call the referring office to let them know.

## 2019-12-17 NOTE — Telephone Encounter (Signed)
Patient called.   He was calling to check the status of his referral to [place]. [place] Spaniel he has not received a call.   Call back: [phone]

## 2019-12-20 NOTE — Telephone Encounter (Signed)
Received referral from Dr. Nickola Major. Left message #1 to schedule OV.

## 2019-12-21 ENCOUNTER — Telehealth: Payer: Self-pay

## 2019-12-21 ENCOUNTER — Telehealth: Payer: Self-pay | Admitting: Physical Medicine and Rehabilitation

## 2019-12-21 NOTE — Telephone Encounter (Signed)
Patient called  Patient is returning your phone call from yesterday.  Call [phone]

## 2019-12-21 NOTE — Telephone Encounter (Signed)
Left message #2

## 2019-12-21 NOTE — Telephone Encounter (Signed)
Patient returned Courtney's call. Please call patient at [phone].

## 2019-12-21 NOTE — Telephone Encounter (Signed)
See previous message

## 2019-12-22 NOTE — Telephone Encounter (Signed)
Pt was sch 01/11/20

## 2019-12-22 NOTE — Telephone Encounter (Signed)
See previous message

## 2019-12-22 NOTE — Telephone Encounter (Signed)
Left message #3

## 2019-12-22 NOTE — Telephone Encounter (Signed)
Pt was sch. 

## 2019-12-29 ENCOUNTER — Other Ambulatory Visit: Payer: Self-pay | Admitting: Internal Medicine

## 2019-12-29 ENCOUNTER — Other Ambulatory Visit (HOSPITAL_COMMUNITY): Payer: Self-pay | Admitting: Internal Medicine

## 2019-12-29 DIAGNOSIS — N202 Calculus of kidney with calculus of ureter: Secondary | ICD-10-CM | POA: Diagnosis not present

## 2019-12-31 ENCOUNTER — Other Ambulatory Visit: Payer: Self-pay

## 2019-12-31 ENCOUNTER — Ambulatory Visit (HOSPITAL_COMMUNITY)
Admission: RE | Admit: 2019-12-31 | Discharge: 2019-12-31 | Disposition: A | Discharge status: Home / Self Care | Payer: BC Managed Care – PPO | Source: Ambulatory Visit | Attending: Internal Medicine | Admitting: Internal Medicine

## 2019-12-31 DIAGNOSIS — N202 Calculus of kidney with calculus of ureter: Secondary | ICD-10-CM | POA: Diagnosis not present

## 2019-12-31 DIAGNOSIS — R109 Unspecified abdominal pain: Secondary | ICD-10-CM | POA: Diagnosis not present

## 2020-01-11 ENCOUNTER — Ambulatory Visit (INDEPENDENT_AMBULATORY_CARE_PROVIDER_SITE_OTHER): Payer: BC Managed Care – PPO | Admitting: Physical Medicine and Rehabilitation

## 2020-01-11 ENCOUNTER — Telehealth: Payer: Self-pay | Admitting: Physical Medicine and Rehabilitation

## 2020-01-11 ENCOUNTER — Ambulatory Visit: Payer: Self-pay

## 2020-01-11 ENCOUNTER — Other Ambulatory Visit: Payer: Self-pay

## 2020-01-11 ENCOUNTER — Encounter: Payer: Self-pay | Admitting: Physical Medicine and Rehabilitation

## 2020-01-11 VITALS — BP 140/85 | HR 68

## 2020-01-11 DIAGNOSIS — M545 Low back pain, unspecified: Secondary | ICD-10-CM

## 2020-01-11 DIAGNOSIS — M47816 Spondylosis without myelopathy or radiculopathy, lumbar region: Secondary | ICD-10-CM

## 2020-01-11 DIAGNOSIS — G894 Chronic pain syndrome: Secondary | ICD-10-CM

## 2020-01-11 DIAGNOSIS — M5116 Intervertebral disc disorders with radiculopathy, lumbar region: Secondary | ICD-10-CM | POA: Diagnosis not present

## 2020-01-11 DIAGNOSIS — G8929 Other chronic pain: Secondary | ICD-10-CM

## 2020-01-11 NOTE — Progress Notes (Signed)
Has been seeing Dr. Eduard Clos since 2004 or 2005. Low back pain. Sometimes has pain in left leg. Was getting 4 shots in lower back from Dr. Eduard Clos. Numeric Pain Rating Scale and Functional Assessment Average Pain 10 Pain Right Now 8 My pain is constant, stabbing and aching Pain is worse with: some activites Pain improves with: injections   In the last MONTH (on 0-10 scale) has pain interfered with the following?  1. General activity like being  able to carry out your everyday physical activities such as walking, climbing stairs, carrying groceries, or moving a chair?  Rating(9)  2. Relation with others like being able to carry out your usual social activities and roles such as  activities at home, at work and in your community. Rating(9)  3. Enjoyment of life such that you have  been bothered by emotional problems such as feeling anxious, depressed or irritable?  Rating(8)

## 2020-01-11 NOTE — Telephone Encounter (Signed)
Is auth needed for bilateral L4-5 and L5-S1 facet injections? Scheduled for 10/1.

## 2020-01-12 ENCOUNTER — Encounter: Payer: Self-pay | Admitting: Physical Medicine and Rehabilitation

## 2020-01-12 NOTE — Telephone Encounter (Signed)
Pt Not Req Auth# for [id number]/ [id number]. Pt can be sch.

## 2020-01-12 NOTE — Progress Notes (Addendum)
Eugene Kelley - 59 y.o. male MRN [id number]  Date of birth: 1960-07-08  Office Visit Note: Visit Date: 01/11/2020 PCP: Heather Roberts, NP Referred by: Wandra Feinstein, MD  Subjective: Chief Complaint  Patient presents with  . Lower Back - Pain   HPI: Eugene Kelley is a 59 y.o. male who comes in today As a new patient evaluation and consultation at the request of Dr. Herminio Heads.  I do have about 50 pages of notes from her and those were reviewed today.  Patient reports almost 20-year history of chronic worsening low back pain with intermittent flareups which are quite severe.  He endorses some pain in the left posterior lateral leg to the knee at times but that is very rare.  He does not endorse any radicular pain or paresthesias past the knees or into the feet.  He rates his pain on average as a 10 out of 10 minutes flared up.  He demonstrates pain across the lower back at the lumbosacral junction and some into the left buttock.  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  He reports for starting seen Dr. Nickola Major back in 2004 and receiving injections that were very beneficial.  He currently does take anti-inflammatory off and on.  He is intolerant of pain medication such as hydrocodone.  He reports a change in job over the last several years where he is not had injections very frequently because he seems not to be flaring up as much.  MRI of the lumbar spine was performed in 2009.  It appears from our notes that he probably got a couple of injections which were epidural injections in 2009 and 2010.  He does not have any red flag complaints of focal weakness or bowel or bladder changes or fevers chills or night sweats.  His case is complicated by morbid obesity.  He reports the injections performed by  Dr. Nickola Major of really helped him quite a bit.  It appears that those injections were bilateral L4-5 and L5-S1 facet joint blocks over the last several years.  He has not had lumbar spine surgery.  He has not had recent x-rays so we did obtain x-rays of the lumbar spine today.  Those are reviewed below in the notes.  This mainly does show facet arthropathy and some degenerative disc changes.  Review of Systems  Musculoskeletal: Positive for back pain and joint pain.  All other systems reviewed and are negative.  Otherwise per HPI.  Assessment & Plan: Visit Diagnoses:  1. Chronic bilateral low back pain without sciatica   2. Spondylosis without myelopathy or radiculopathy, lumbar region   3. Radiculopathy due to lumbar intervertebral disc disorder   4. Chronic pain syndrome     Plan: Findings:  Chronic many year history of back pain predominantly with some referral in the left hip and buttock region with remote history of small annular tears and bulging as well as facet arthropathy.  Injections by Dr. Nickola Major been very successful over the years and he has gotten anywhere from 3 and a year to just 1.  Last injection seems to be performed in April of this year.  These injections with her have been bilateral L4-5 and L5-S1 facet joint blocks.  He has not had recent  MRI but has no red flag complaints at this point to warrant MRI.  I would be pretty quick to get one in the future depending on how the treatment goes.  I do recommend that we get him in because of recent flareup to complete bilateral L4-5 and L5-S1 facet joint blocks.  Ultimately he is seeing [place] because Dr. Nickola Major no longer takes his insurance.  Recommend continued work with weight loss and core strengthening given that he is very active.  Concern would be potential for some stenosis not shown on x-ray obviously.    Meds & Orders: No orders of the defined types were placed in this encounter.   Orders Placed This  Encounter  Procedures  . XR Lumbar Spine 2-3 Views    Follow-up: Return for bilateral L4-5 and L5-S1 facet joint injection.   Procedures: No procedures performed  No notes on file   Clinical History: MRI LUMBAR SPINE WITHOUT CONTRAST  2009   Technique: Multiplanar and multiecho pulse sequences of the lumbar  spine were obtained without intravenous contrast.    Comparison: CT abdomen and pelvis 12/31/2007.    Findings: Numbering convention used for this exam terms L5-S1 as  the last full intervertebral disc space of the sacrum. Alignment  is anatomic. Marrow signal is normal within the lumbar spine  however there is some marrow edema in the anterior T11 vertebral  body. Physiologic wedging of T11 is present, with superior and  inferior endplate Schmorl's nodes. No significant stenosis is  present at this level. The paraspinal soft tissues appear within  normal limits.    L1-L2: Negative    L2-L3: Negative    L3-L4: Mild disc desiccation and shallow bulging into both neural  foramina without stenosis.    L4-L5: High signal intensity zone is present within the posterior  central L4-L5 disc, consistent with concentric annular tear. There  is an associated broad-based posterior disc bulge, with more focal  small central protrusion. This produces a mild central canal  stenosis, with minimal mass effect on the lateral recesses, left  slightly greater than right. The inflammatory changes associated  with a annular tear could produce irritation of either L5 nerve  root in the lateral recess. Neural foramina patent.    L5-S1: Disc desiccation with right eccentric broad-based posterior  disc bulge. Tiny right paracentral and posterolateral inferior  peripheral annular tear is present, associated with the right  eccentric disc bulge. This could produce irritation of the right s  one nerve root in the lateral recess. Neural foramina appear  adequately  patent bilaterally. Central canal is patent.    IMPRESSION:  1. Mild lumbar degenerative disease, with L4-L5 and L5-S1 annular  tears with associated bulge/protrusion as described above. Tears  could produce irritation of either L5 nerve root and possibly the  right S1 nerve root in the lateral recess.  2. Mild inflammatory changes in the T11 vertebral body, probably  related to Schmorl's node in the superior endplate.   Provider: Elizebeth Brooking, Rodney Booze   He reports that he has never smoked. He has never used smokeless tobacco. No results for input(s): HGBA1C, LABURIC in the last 8760 hours.  Objective:  VS:  HT:    WT:   BMI:     BP:140/85  HR:68bpm  TEMP: ( )  RESP:  Physical Exam Constitutional:      General: He is not in acute distress.    Appearance: Normal appearance. He is obese. He is not ill-appearing.  HENT:     Head: Normocephalic and atraumatic.     Right Ear: External ear normal.     Left Ear: External ear normal.  Eyes:     Extraocular Movements: Extraocular movements intact.  Cardiovascular:     Rate and Rhythm: Normal rate.     Pulses: Normal pulses.  Pulmonary:     Effort: Pulmonary effort is normal.  Abdominal:     General: There is no distension.     Palpations: Abdomen is soft.  Musculoskeletal:        General: No tenderness or signs of injury.     Cervical back: Normal range of motion and neck supple. No rigidity or tenderness.     Right lower leg: No edema.     Left lower leg: No edema.     Comments: Patient has good distal strength without clonus.  He has no pain with hip internal rotation although he has some pain with external rotation on the left.  No pain over the greater trochanters.  Some pain over the left PSIS compared to right.  He ambulates with a forward flexed lumbar spine he has some difficulty going to sit to stand.  He has concordant low back pain with facet loading and extension.  Skin:    Findings: No erythema or rash.    Neurological:     General: No focal deficit present.     Mental Status: He is alert and oriented to person, place, and time.     Sensory: No sensory deficit.     Motor: No weakness or abnormal muscle tone.     Coordination: Coordination normal.     Gait: Gait normal.  Psychiatric:        Mood and Affect: Mood normal.        Behavior: Behavior normal.     Ortho Exam  Imaging: XR Lumbar Spine 2-3 Views  Result Date: 01/11/2020 AP and lateral lumbar spine shows normal anatomic alignment both AP and lateral with no listhesis or scoliosis.  There is degenerative joint changes at L4-5 and L5-S1.  Sacroiliac joints are well-maintained no fractures or dislocations.  Hip joints are visible and there is good joint spacing with only minimal degenerative changes.   Past Medical/Family/Surgical/Social History: Medications & Allergies reviewed per EMR, new medications updated. Patient Active Problem List   Diagnosis Date Noted  . Fatigue 12/14/2018  . Chronic back pain 12/14/2018  . Short of breath on exertion 12/14/2018  . Dental caries 12/14/2018   Past Medical History:  Diagnosis Date  . Allergy   . Arthritis   . Back pain    chronic  . GERD (gastroesophageal reflux disease)   . Kidney stones    Family History  Problem Relation Age of Onset  . Heart disease Mother   . Hyperlipidemia Mother   . Hypertension Mother   . Cancer Father   . Heart disease Father   . Hyperlipidemia Father   . Hypertension Father   . Stroke Father   . Diabetes Sister   . Heart disease Sister   . Hyperlipidemia Sister   . Hypertension Sister   . Hyperlipidemia Brother   . Diabetes Brother   . Heart disease Maternal Grandmother   . Hyperlipidemia Maternal Grandmother   . Cancer Maternal Grandfather   . Heart disease Maternal Grandfather   . Hyperlipidemia Maternal Grandfather   . Hypertension Maternal Grandfather   . Heart disease Paternal Grandmother   . Hyperlipidemia Paternal Grandmother    .  Hypertension Paternal Grandmother   . Cancer Paternal Grandfather    Past Surgical History:  Procedure Laterality Date  . EYE SURGERY    . HERNIA REPAIR     Social History   Occupational History  . Occupation: truck Air traffic controllerdriver    Employer: [employer]  Tobacco Use  . Smoking status: Never Smoker  . Smokeless tobacco: Never Used  Vaping Use  . Vaping Use: Never used  Substance and Sexual Activity  . Alcohol use: No  . Drug use: No  . Sexual activity: Yes    Birth control/protection: Condom

## 2020-01-21 ENCOUNTER — Ambulatory Visit: Payer: Self-pay

## 2020-01-21 ENCOUNTER — Encounter: Payer: Self-pay | Admitting: Physical Medicine and Rehabilitation

## 2020-01-21 ENCOUNTER — Ambulatory Visit (INDEPENDENT_AMBULATORY_CARE_PROVIDER_SITE_OTHER): Payer: BC Managed Care – PPO | Admitting: Physical Medicine and Rehabilitation

## 2020-01-21 ENCOUNTER — Other Ambulatory Visit: Payer: Self-pay

## 2020-01-21 DIAGNOSIS — G8929 Other chronic pain: Secondary | ICD-10-CM

## 2020-01-21 DIAGNOSIS — M47816 Spondylosis without myelopathy or radiculopathy, lumbar region: Secondary | ICD-10-CM

## 2020-01-21 DIAGNOSIS — M545 Low back pain, unspecified: Secondary | ICD-10-CM

## 2020-01-21 NOTE — Progress Notes (Signed)
Here for planned bilateral L4-5 and L5-S1 facets. No changes to pain across lower back. Numeric Pain Rating Scale and Functional Assessment Average Pain 9   In the last MONTH (on 0-10 scale) has pain interfered with the following?  1. General activity like being  able to carry out your everyday physical activities such as walking, climbing stairs, carrying groceries, or moving a chair?  Rating(8)   +Driver, -BT, -Dye Allergies.

## 2020-01-28 NOTE — Procedures (Signed)
Lumbar Diagnostic Facet Joint Nerve Block with Fluoroscopic Guidance   Patient: Eugene Kelley      Date of Birth: 1960/10/27 MRN: [id number] PCP: Heather Roberts, NP      Visit Date: 01/21/2020   Universal Protocol:    Date/Time: 10/08/215:50 AM  Consent Given By: the patient  Position: PRONE  Additional Comments: Vital signs were monitored before and after the procedure. Patient was prepped and draped in the usual sterile fashion. The correct patient, procedure, and site was verified.   Injection Procedure Details:   Procedure diagnoses:  1. Chronic bilateral low back pain without sciatica   2. Spondylosis without myelopathy or radiculopathy, lumbar region      Meds Administered: No orders of the defined types were placed in this encounter.    Laterality: Bilateral  Location/Site:  L4-L5 L5-S1  Needle size: 22 ga.  Needle type:spinal  Needle Placement: Oblique pedical  Findings:   -Comments: There was excellent flow of contrast along the articular pillars without intravascular flow.  Procedure Details: The fluoroscope beam is vertically oriented in AP and then obliqued 15 to 20 degrees to the ipsilateral side of the desired nerve to achieve the "Scotty dog" appearance.  The skin over the target area of the junction of the superior articulating process and the transverse process (sacral ala if blocking the L5 dorsal rami) was locally anesthetized with a 1 ml volume of 1% Lidocaine without Epinephrine.  The spinal needle was inserted and advanced in a trajectory view down to the target.   After contact with periosteum and negative aspirate for blood and CSF, correct placement without intravascular or epidural spread was confirmed by injecting 0.5 ml. of Isovue-250.  A spot radiograph was obtained of this image.    Next, a 0.5 ml. volume of the injectate described above was injected. The needle was then redirected to the other facet joint nerves mentioned above if  needed.  Prior to the procedure, the patient was given a Pain Diary which was completed for baseline measurements.  After the procedure, the patient rated their pain every 30 minutes and will continue rating at this frequency for a total of 5 hours.  The patient has been asked to complete the Diary and return to [place] by mail, fax or hand delivered as soon as possible.   Additional Comments:  The patient tolerated the procedure well Dressing: 2 x 2 sterile gauze and Band-Aid    Post-procedure details: Patient was observed during the procedure. Post-procedure instructions were reviewed.  Patient left the clinic in stable condition.

## 2020-01-28 NOTE — Progress Notes (Signed)
Eugene Kelley - 59 y.o. male MRN [id number]  Date of birth: 06-08-1960  Office Visit Note: Visit Date: 01/21/2020 PCP: Heather Roberts, NP Referred by: Heather Roberts, NP  Subjective: Chief Complaint  Patient presents with  . Lower Back - Pain   HPI:  Eugene Kelley is a 59 y.o. male who comes in today at the request of Dr. Naaman Plummer for planned Bilateral L4-L5 L5-S1 Lumbar facet/medial branch block with fluoroscopic guidance.  The patient has failed conservative care including home exercise, medications, time and activity modification.  This injection will be diagnostic and hopefully therapeutic.  Please see requesting physician notes for further details and justification.  Exam has shown concordant pain with facet joint loading.  ROS Otherwise per HPI.  Assessment & Plan: Visit Diagnoses:  1. Chronic bilateral low back pain without sciatica   2. Spondylosis without myelopathy or radiculopathy, lumbar region     Plan: No additional findings.   Meds & Orders: No orders of the defined types were placed in this encounter.   Orders Placed This Encounter  Procedures  . XR C-ARM NO REPORT    Follow-up: No follow-ups on file.   Procedures: No procedures performed  No notes on file   Clinical History: MRI LUMBAR SPINE WITHOUT CONTRAST  2009   Technique: Multiplanar and multiecho pulse sequences of the lumbar  spine were obtained without intravenous contrast.    Comparison: CT abdomen and pelvis 12/31/2007.    Findings: Numbering convention used for this exam terms L5-S1 as  the last full intervertebral disc space of the sacrum. Alignment  is anatomic. Marrow signal is normal within the lumbar spine  however there is some marrow edema in the anterior T11 vertebral  body. Physiologic wedging of T11 is present, with superior and  inferior endplate Schmorl's nodes. No significant stenosis is  present at this level. The paraspinal soft tissues appear within    normal limits.    L1-L2: Negative    L2-L3: Negative    L3-L4: Mild disc desiccation and shallow bulging into both neural  foramina without stenosis.    L4-L5: High signal intensity zone is present within the posterior  central L4-L5 disc, consistent with concentric annular tear. There  is an associated broad-based posterior disc bulge, with more focal  small central protrusion. This produces a mild central canal  stenosis, with minimal mass effect on the lateral recesses, left  slightly greater than right. The inflammatory changes associated  with a annular tear could produce irritation of either L5 nerve  root in the lateral recess. Neural foramina patent.    L5-S1: Disc desiccation with right eccentric broad-based posterior  disc bulge. Tiny right paracentral and posterolateral inferior  peripheral annular tear is present, associated with the right  eccentric disc bulge. This could produce irritation of the right s  one nerve root in the lateral recess. Neural foramina appear  adequately patent bilaterally. Central canal is patent.    IMPRESSION:  1. Mild lumbar degenerative disease, with L4-L5 and L5-S1 annular  tears with associated bulge/protrusion as described above. Tears  could produce irritation of either L5 nerve root and possibly the  right S1 nerve root in the lateral recess.  2. Mild inflammatory changes in the T11 vertebral body, probably  related to Schmorl's node in the superior endplate.   Provider: Elizebeth Brooking, Rodney Booze     Objective:  VS:  HT:    WT:   BMI:  BP:   HR: bpm  TEMP: ( )  RESP:  Physical Exam Constitutional:      General: He is not in acute distress.    Appearance: Normal appearance. He is not ill-appearing.  HENT:     Head: Normocephalic and atraumatic.     Right Ear: External ear normal.     Left Ear: External ear normal.  Eyes:     Extraocular Movements: Extraocular movements intact.   Cardiovascular:     Rate and Rhythm: Normal rate.     Pulses: Normal pulses.  Abdominal:     General: There is no distension.     Palpations: Abdomen is soft.  Musculoskeletal:        General: No tenderness or signs of injury.     Right lower leg: No edema.     Left lower leg: No edema.     Comments: Patient has good distal strength without clonus.Patient somewhat slow to rise from a seated position to full extension.  There is concordant low back pain with facet loading and lumbar spine extension rotation.  There are no definitive trigger points but the patient is somewhat tender across the lower back and PSIS.  There is no pain with hip rotation.   Skin:    Findings: No erythema or rash.  Neurological:     General: No focal deficit present.     Mental Status: He is alert and oriented to person, place, and time.     Sensory: No sensory deficit.     Motor: No weakness or abnormal muscle tone.     Coordination: Coordination normal.  Psychiatric:        Mood and Affect: Mood normal.        Behavior: Behavior normal.      Imaging: No results found.

## 2020-05-29 DIAGNOSIS — Z0189 Encounter for other specified special examinations: Secondary | ICD-10-CM | POA: Diagnosis not present

## 2020-06-05 DIAGNOSIS — L989 Disorder of the skin and subcutaneous tissue, unspecified: Secondary | ICD-10-CM | POA: Diagnosis not present

## 2020-06-05 DIAGNOSIS — I10 Essential (primary) hypertension: Secondary | ICD-10-CM | POA: Diagnosis not present

## 2020-06-05 DIAGNOSIS — E039 Hypothyroidism, unspecified: Secondary | ICD-10-CM | POA: Diagnosis not present

## 2020-06-05 DIAGNOSIS — K219 Gastro-esophageal reflux disease without esophagitis: Secondary | ICD-10-CM | POA: Diagnosis not present

## 2020-06-23 DIAGNOSIS — R059 Cough, unspecified: Secondary | ICD-10-CM | POA: Diagnosis not present

## 2020-06-23 DIAGNOSIS — R062 Wheezing: Secondary | ICD-10-CM | POA: Diagnosis not present

## 2020-07-12 ENCOUNTER — Telehealth: Payer: Self-pay | Admitting: Physical Medicine and Rehabilitation

## 2020-07-12 NOTE — Telephone Encounter (Signed)
Pt called stating he has a sharp pain spreading from his left glute and down his leg;and he also mentioned an injection pt would like to set an appt.  [phone]

## 2020-07-12 NOTE — Telephone Encounter (Signed)
Patient had bilateral L4-5 and L5-S1 facet injections on 01/21/20. Please advise.

## 2020-07-12 NOTE — Telephone Encounter (Signed)
Ok to repeat if helped, but if not or is somewhat different then he needs MRI or OV

## 2020-07-13 NOTE — Telephone Encounter (Signed)
Left message #1

## 2020-08-01 ENCOUNTER — Encounter: Payer: Self-pay | Admitting: Physical Medicine and Rehabilitation

## 2020-08-01 ENCOUNTER — Ambulatory Visit: Payer: BC Managed Care – PPO | Admitting: Physical Medicine and Rehabilitation

## 2020-08-01 ENCOUNTER — Other Ambulatory Visit: Payer: Self-pay

## 2020-08-01 VITALS — BP 119/75 | HR 70

## 2020-08-01 DIAGNOSIS — M47816 Spondylosis without myelopathy or radiculopathy, lumbar region: Secondary | ICD-10-CM | POA: Diagnosis not present

## 2020-08-01 DIAGNOSIS — G8929 Other chronic pain: Secondary | ICD-10-CM

## 2020-08-01 DIAGNOSIS — M545 Low back pain, unspecified: Secondary | ICD-10-CM

## 2020-08-01 DIAGNOSIS — M5116 Intervertebral disc disorders with radiculopathy, lumbar region: Secondary | ICD-10-CM | POA: Diagnosis not present

## 2020-08-01 NOTE — Progress Notes (Signed)
Pain in low back and left buttock and posterior left thigh. bilateral L4-5 and L5-S1 facet injections on 01/21/20- patient states that the injection did help. States that sometimes he "can't even pick up his left leg." Numeric Pain Rating Scale and Functional Assessment Average Pain 10 Pain Right Now 5 My pain is intermittent, dull and aching Pain is worse with: walking and unsure Pain improves with: heat/ice   In the last MONTH (on 0-10 scale) has pain interfered with the following?  1. General activity like being  able to carry out your everyday physical activities such as walking, climbing stairs, carrying groceries, or moving a chair?  Rating(5)  2. Relation with others like being able to carry out your usual social activities and roles such as  activities at home, at work and in your community. Rating(5)  3. Enjoyment of life such that you have  been bothered by emotional problems such as feeling anxious, depressed or irritable?  Rating(4)

## 2020-08-02 ENCOUNTER — Encounter: Payer: Self-pay | Admitting: Physical Medicine and Rehabilitation

## 2020-08-02 NOTE — Progress Notes (Signed)
Eugene Kelley - 60 y.o. male MRN [id number]  Date of birth: 1960/09/19  Office Visit Note: Visit Date: 08/01/2020 PCP: Heather Roberts, NP Referred by: Heather Roberts, NP  Subjective: Chief Complaint  Patient presents with  . Lower Back - Pain   HPI: Eugene Kelley is a 60 y.o. male who comes in today For evaluation management of chronic worsening severe low back pain.  He reports 10 out of 10 intermittent dull aching low back pain particularly into the left buttock and posterior left thigh.  He does not report any symptoms to the foot.  He does not report any paresthesias.  No right-sided symptoms in the hip or leg.  He reports "he can even pick up his left leg sometimes."  He denies any groin pain or any history of hip issues.  He has had no specific trauma since have seen him.  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  His brief history is that he saw Dr. Herminio Heads for many years and had mostly facet joint blocks of the lower spine with good relief.  We completed facet joint blocks in October that he said did help his lower back but severely different in this left hip and leg.  His last MRI was in 2006.  He denies any focal weakness any red flag complaints of fevers chills night sweats or focal night pain.  Review of Systems  Musculoskeletal: Positive for back pain and joint pain.       Left hip and thigh pain  All other systems reviewed and are negative.  Otherwise per HPI.  Assessment & Plan: Visit Diagnoses:    ICD-10-CM   1. Chronic bilateral low back pain without sciatica  M54.50 MR LUMBAR SPINE WO CONTRAST   G89.29   2. Spondylosis without myelopathy or radiculopathy, lumbar region  M47.816 MR LUMBAR SPINE WO CONTRAST  3. Radiculopathy due to lumbar intervertebral disc disorder  M51.16 MR LUMBAR SPINE WO CONTRAST     Plan: Findings:  Chronic worsening  severe low back and left buttock and posterior thigh pain and what is either referral pattern from facet joint or radicular complaint into the posterior hamstring to the knee.  He did get some relief with facet joint block in October he reports this is slightly different.  He has not had recent MRI and I think before we do anything we need to get MRI of the lumbar spine.  Last MRI was in 2006.  This did show disc annular tears and protrusions without focal herniation.  He may indeed have a herniated disc at L4-5 or L5-S1.  He clearly has facet arthropathy the MRI would be good to look at to see if that is worsened.  He is getting to the age where stenosis may be starting to accumulate.  He will continue with current medications and activity modification.  We will order the MRI and get that scheduled.    Meds & Orders: No orders of the defined types were placed in this encounter.   Orders Placed This Encounter  Procedures  . MR LUMBAR SPINE WO CONTRAST    Follow-up: Return for MRI review after completion.   Procedures: No procedures performed      Clinical History: MRI LUMBAR SPINE WITHOUT CONTRAST  2009   Technique: Multiplanar and multiecho pulse sequences of the lumbar  spine were obtained without intravenous contrast.    Comparison: CT abdomen and pelvis 12/31/2007.    Findings: Numbering convention used for this exam terms L5-S1 as  the last full intervertebral disc space of the sacrum. Alignment  is anatomic. Marrow signal is normal within the lumbar spine  however there is some marrow edema in the anterior T11 vertebral  body. Physiologic wedging of T11 is present, with superior and  inferior endplate Schmorl's nodes. No significant stenosis is  present at this level. The paraspinal soft tissues appear within  normal limits.    L1-L2: Negative    L2-L3: Negative    L3-L4: Mild disc desiccation and shallow bulging into both neural  foramina without stenosis.     L4-L5: High signal intensity zone is present within the posterior  central L4-L5 disc, consistent with concentric annular tear. There  is an associated broad-based posterior disc bulge, with more focal  small central protrusion. This produces a mild central canal  stenosis, with minimal mass effect on the lateral recesses, left  slightly greater than right. The inflammatory changes associated  with a annular tear could produce irritation of either L5 nerve  root in the lateral recess. Neural foramina patent.    L5-S1: Disc desiccation with right eccentric broad-based posterior  disc bulge. Tiny right paracentral and posterolateral inferior  peripheral annular tear is present, associated with the right  eccentric disc bulge. This could produce irritation of the right s  one nerve root in the lateral recess. Neural foramina appear  adequately patent bilaterally. Central canal is patent.    IMPRESSION:  1. Mild lumbar degenerative disease, with L4-L5 and L5-S1 annular  tears with associated bulge/protrusion as described above. Tears  could produce irritation of either L5 nerve root and possibly the  right S1 nerve root in the lateral recess.  2. Mild inflammatory changes in the T11 vertebral body, probably  related to Schmorl's node in the superior endplate.   Provider: Elizebeth Brooking, Rodney Booze   He reports that he has never smoked. He has never used smokeless tobacco. No results for input(s): HGBA1C, LABURIC in the last 8760 hours.  Objective:  VS:  HT:    WT:   BMI:     BP:119/75  HR:70bpm  TEMP: ( )  RESP:  Physical Exam Vitals and nursing note reviewed.  Constitutional:      General: He is not in acute distress.    Appearance: Normal appearance. He is obese. He is not ill-appearing.  HENT:     Head: Normocephalic and atraumatic.     Right Ear: External ear normal.     Left Ear: External ear normal.     Nose: No congestion.  Eyes:     Extraocular  Movements: Extraocular movements intact.  Cardiovascular:     Rate and Rhythm: Normal rate.     Pulses: Normal pulses.  Pulmonary:     Effort: Pulmonary effort is normal. No respiratory distress.  Abdominal:     General: There is no distension.     Palpations: Abdomen is soft.  Musculoskeletal:        General: No tenderness or signs of injury.     Cervical back: Neck supple.     Right lower leg: No edema.     Left lower leg: No edema.     Comments: Patient has good distal strength without clonus.  Positive slump test on the left.  No pain with hip rotation internal or external.  He has  good strength throughout.  He has concordant back pain with extension and facet loading.  Skin:    Findings: No erythema or rash.  Neurological:     General: No focal deficit present.     Mental Status: He is alert and oriented to person, place, and time.     Sensory: No sensory deficit.     Motor: No weakness or abnormal muscle tone.     Coordination: Coordination normal.  Psychiatric:        Mood and Affect: Mood normal.        Behavior: Behavior normal.     Ortho Exam  Imaging: No results found.  Past Medical/Family/Surgical/Social History: Medications & Allergies reviewed per EMR, new medications updated. Patient Active Problem List   Diagnosis Date Noted  . Fatigue 12/14/2018  . Chronic back pain 12/14/2018  . Short of breath on exertion 12/14/2018  . Dental caries 12/14/2018   Past Medical History:  Diagnosis Date  . Allergy   . Arthritis   . Back pain    chronic  . GERD (gastroesophageal reflux disease)   . Kidney stones    Family History  Problem Relation Age of Onset  . Heart disease Mother   . Hyperlipidemia Mother   . Hypertension Mother   . Cancer Father   . Heart disease Father   . Hyperlipidemia Father   . Hypertension Father   . Stroke Father   . Diabetes Sister   . Heart disease Sister   . Hyperlipidemia Sister   . Hypertension Sister   . Hyperlipidemia  Brother   . Diabetes Brother   . Heart disease Maternal Grandmother   . Hyperlipidemia Maternal Grandmother   . Cancer Maternal Grandfather   . Heart disease Maternal Grandfather   . Hyperlipidemia Maternal Grandfather   . Hypertension Maternal Grandfather   . Heart disease Paternal Grandmother   . Hyperlipidemia Paternal Grandmother   . Hypertension Paternal Grandmother   . Cancer Paternal Grandfather    Past Surgical History:  Procedure Laterality Date  . EYE SURGERY    . HERNIA REPAIR     Social History   Occupational History  . Occupation: truck Air traffic controller: [employer]  Tobacco Use  . Smoking status: Never Smoker  . Smokeless tobacco: Never Used  Vaping Use  . Vaping Use: Never used  Substance and Sexual Activity  . Alcohol use: No  . Drug use: No  . Sexual activity: Yes    Birth control/protection: Condom

## 2020-08-15 ENCOUNTER — Ambulatory Visit (HOSPITAL_COMMUNITY)
Admission: RE | Admit: 2020-08-15 | Discharge: 2020-08-15 | Disposition: A | Discharge status: Home / Self Care | Payer: BC Managed Care – PPO | Source: Ambulatory Visit | Attending: Physical Medicine and Rehabilitation | Admitting: Physical Medicine and Rehabilitation

## 2020-08-15 DIAGNOSIS — M545 Low back pain, unspecified: Secondary | ICD-10-CM | POA: Insufficient documentation

## 2020-08-15 DIAGNOSIS — G8929 Other chronic pain: Secondary | ICD-10-CM | POA: Insufficient documentation

## 2020-08-15 DIAGNOSIS — M5116 Intervertebral disc disorders with radiculopathy, lumbar region: Secondary | ICD-10-CM | POA: Diagnosis not present

## 2020-08-15 DIAGNOSIS — M47816 Spondylosis without myelopathy or radiculopathy, lumbar region: Secondary | ICD-10-CM | POA: Insufficient documentation

## 2020-08-17 ENCOUNTER — Telehealth: Payer: Self-pay | Admitting: Physical Medicine and Rehabilitation

## 2020-08-17 NOTE — Telephone Encounter (Signed)
Called pt and sch OV 

## 2020-08-29 ENCOUNTER — Other Ambulatory Visit: Payer: Self-pay

## 2020-08-29 ENCOUNTER — Ambulatory Visit: Payer: BC Managed Care – PPO | Admitting: Physical Medicine and Rehabilitation

## 2020-08-29 ENCOUNTER — Encounter: Payer: Self-pay | Admitting: Physical Medicine and Rehabilitation

## 2020-08-29 VITALS — BP 122/78 | HR 64

## 2020-08-29 DIAGNOSIS — M5116 Intervertebral disc disorders with radiculopathy, lumbar region: Secondary | ICD-10-CM

## 2020-08-29 DIAGNOSIS — M47816 Spondylosis without myelopathy or radiculopathy, lumbar region: Secondary | ICD-10-CM | POA: Diagnosis not present

## 2020-08-29 DIAGNOSIS — M5442 Lumbago with sciatica, left side: Secondary | ICD-10-CM | POA: Diagnosis not present

## 2020-08-29 DIAGNOSIS — G8929 Other chronic pain: Secondary | ICD-10-CM

## 2020-08-29 NOTE — Progress Notes (Signed)
Here for MRI review. Leg pain has improved. Pain across low back. Sometimes pain is worse with sitting in his truck for long periods of time and with pulling himself up into his truck. Numeric Pain Rating Scale and Functional Assessment Average Pain 6   In the last MONTH (on 0-10 scale) has pain interfered with the following?  1. General activity like being  able to carry out your everyday physical activities such as walking, climbing stairs, carrying groceries, or moving a chair?  Rating(5)

## 2020-10-14 ENCOUNTER — Encounter: Payer: Self-pay | Admitting: Physical Medicine and Rehabilitation

## 2020-10-14 NOTE — Progress Notes (Signed)
Eugene Kelley - 60 y.o. male MRN [id number]  Date of birth: Jan 22, 1961  Office Visit Note: Visit Date: 08/29/2020 PCP: Benita Stabile, MD Referred by: Benita Stabile, MD  Subjective: Chief Complaint  Patient presents with   Lower Back - Pain   HPI: Eugene Kelley is a 60 y.o. male who comes in today For follow-up evaluation and management of chronic worsening severe low back pain.  He reports since have seen him last a lot of his leg pain has improved.  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  He was seen by Dr.Shawn Dalton-Bethea and actually had multiple injections with her with more relief with facet joint blocks than anything else.  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  New MRI was obtained to the fact that he was having continued hip and leg pain and back pain.  Rates his average pain still as a 6 out of 10.  He continues on home exercise program and has had physical therapy in the past but not recently.  He does do a pretty physical activity with his job.  He gets worsening symptoms sitting in his truck for a long time.  No red flag complaints.  Review of Systems  Musculoskeletal:  Positive for back pain.  All other systems reviewed and are negative. Otherwise per HPI.  Assessment & Plan: Visit Diagnoses:    ICD-10-CM   1. Radiculopathy due to lumbar intervertebral disc disorder  M51.16     2. Spondylosis without myelopathy or radiculopathy, lumbar region  M47.816     3. Chronic bilateral low back pain with left-sided sciatica  M54.42    G89.29        Plan: Findings:  Chronic history of back pain and chronic pain syndrome has done well in the past with prior pain management physician with bilateral facet joint blocks of the lower spine.  Updated MRI does show mainly facet arthropathy with some lateral recess narrowing.  We spent more  than 25 minutes today face-to-face with the patient looking at his options for the future with his chronic back pain.  He will continue to take over-the-counter medications and he is on a directed home exercise program with core strengthening.  We discussed weight loss with him.  We discussed the fact that it while he is driving if he could take more stops with just getting out of the seat even for short period of time may help.  Currently his pain level is down and were not looking at injection at this point.  Because he is done well with medial branch blocks and facet joint blocks I would look at radiofrequency ablation as a more definitive treatment for him.  He really does not have anything surgical or high-grade nerve compression.  Could ultimately be a candidate for stimulator trial.   Meds & Orders: No orders of the defined types were placed in this encounter.  No orders of the defined types were placed in this encounter.   Follow-up: Return if symptoms worsen or fail to improve.   Procedures: No procedures performed      Clinical History: MRI LUMBAR SPINE WITHOUT CONTRAST   TECHNIQUE: Multiplanar, multisequence MR imaging of the lumbar spine was performed. No intravenous contrast was administered.   COMPARISON:  01/05/2008   FINDINGS: Segmentation:  5  lumbar type vertebral bodies.   Alignment:  Normal   Vertebrae:  No fracture or primary bone lesion.   Conus medullaris and cauda equina: Conus extends to the L1 level. Conus and cauda equina appear normal.   Paraspinal and other soft tissues: Negative   Disc levels:   T12-L1: Mild bulging of the disc. No stenosis or neural compression.   L1-2: Normal interspace.   L2-3: Mild bulging of the disc towards the right. Minimal facet prominence. Mild narrowing of the right lateral recess and intervertebral foramen but without likely neural compression.   L3-4: Disc degeneration with loss of disc height. Mild bulging of the  disc. Mild facet hypertrophy. Mild narrowing of both foramina but without visible neural compression.   L4-5: Mild bulging of the disc slightly more prominent towards the left. Mild narrowing of the left lateral recess and intervertebral foramen on the left. Definite neural compression is not established, but neural irritation could possibly occur.   L5-S1: Mild bulging of the disc. Facet osteoarthritis, somewhat worse on the right than the left with a right effusion. No compressive narrowing the canal or foramina. The facet arthritis could be associated with back pain or referred facet syndrome pain, seemingly more likely on the right. Left L5 and S1 root sleeves are conjoined.   IMPRESSION: 1. Mild non-compressive degenerative changes at T12-L1, L2-3 and L3-4 as outlined above. 2. At L4-5, there is mild narrowing of the left lateral recess and intervertebral foramen on the left due to bulging of the disc more prominent towards the left. Definite neural compression is not established, but neural irritation could possibly occur. 3. At L5-S1, there is facet osteoarthritis worse on the right than the left with a right effusion. This could be a cause of back pain or referred facet syndrome pain, particularly on the right. Left L5 and S1 root sleeves are conjoined, which can rarely be symptomatic.     Electronically Signed   By: Paulina Fusi M.D.   On: 08/15/2020 08:27   He reports that he has never smoked. He has never used smokeless tobacco. No results for input(s): HGBA1C, LABURIC in the last 8760 hours.  Objective:  VS:  HT:    WT:   BMI:     BP:122/78  HR:64bpm  TEMP: ( )  RESP:  Physical Exam Vitals and nursing note reviewed.  Constitutional:      General: He is not in acute distress.    Appearance: Normal appearance. He is obese. He is not ill-appearing.  HENT:     Head: Normocephalic and atraumatic.     Right Ear: External ear normal.     Left Ear: External ear  normal.     Nose: No congestion.  Eyes:     Extraocular Movements: Extraocular movements intact.  Cardiovascular:     Rate and Rhythm: Normal rate.     Pulses: Normal pulses.  Pulmonary:     Effort: Pulmonary effort is normal. No respiratory distress.  Abdominal:     General: There is no distension.     Palpations: Abdomen is soft.  Musculoskeletal:        General: No tenderness or signs of injury.     Cervical back: Neck supple.     Right lower leg: No edema.     Left lower leg: No edema.     Comments: Patient has good distal strength without clonus. Patient somewhat slow to rise from a seated position to full extension.  There is concordant  low back pain with facet loading and lumbar spine extension rotation.  There are no definitive trigger points but the patient is somewhat tender across the lower back and PSIS.  There is no pain with hip rotation.   Skin:    Findings: No erythema or rash.  Neurological:     General: No focal deficit present.     Mental Status: He is alert and oriented to person, place, and time.     Sensory: No sensory deficit.     Motor: No weakness or abnormal muscle tone.     Coordination: Coordination normal.  Psychiatric:        Mood and Affect: Mood normal.        Behavior: Behavior normal.    Ortho Exam  Imaging: No results found.  Past Medical/Family/Surgical/Social History: Medications & Allergies reviewed per EMR, new medications updated. Patient Active Problem List   Diagnosis Date Noted   Fatigue 12/14/2018   Chronic back pain 12/14/2018   Short of breath on exertion 12/14/2018   Dental caries 12/14/2018   Past Medical History:  Diagnosis Date   Allergy    Arthritis    Back pain    chronic   GERD (gastroesophageal reflux disease)    Kidney stones    Family History  Problem Relation Age of Onset   Heart disease Mother    Hyperlipidemia Mother    Hypertension Mother    Cancer Father    Heart disease Father    Hyperlipidemia  Father    Hypertension Father    Stroke Father    Diabetes Sister    Heart disease Sister    Hyperlipidemia Sister    Hypertension Sister    Hyperlipidemia Brother    Diabetes Brother    Heart disease Maternal Grandmother    Hyperlipidemia Maternal Grandmother    Cancer Maternal Grandfather    Heart disease Maternal Grandfather    Hyperlipidemia Maternal Grandfather    Hypertension Maternal Grandfather    Heart disease Paternal Grandmother    Hyperlipidemia Paternal Grandmother    Hypertension Paternal Grandmother    Cancer Paternal Grandfather    Past Surgical History:  Procedure Laterality Date   EYE SURGERY     HERNIA REPAIR     Social History   Occupational History   Occupation: truck Air traffic controller: [employer]  Tobacco Use   Smoking status: Never   Smokeless tobacco: Never  Vaping Use   Vaping Use: Never used  Substance and Sexual Activity   Alcohol use: No   Drug use: No   Sexual activity: Yes    Birth control/protection: Condom

## 2020-12-01 DIAGNOSIS — E0590 Thyrotoxicosis, unspecified without thyrotoxic crisis or storm: Secondary | ICD-10-CM | POA: Diagnosis not present

## 2020-12-01 DIAGNOSIS — I10 Essential (primary) hypertension: Secondary | ICD-10-CM | POA: Diagnosis not present

## 2020-12-04 DIAGNOSIS — I10 Essential (primary) hypertension: Secondary | ICD-10-CM | POA: Diagnosis not present

## 2020-12-04 DIAGNOSIS — B009 Herpesviral infection, unspecified: Secondary | ICD-10-CM | POA: Diagnosis not present

## 2020-12-04 DIAGNOSIS — K219 Gastro-esophageal reflux disease without esophagitis: Secondary | ICD-10-CM | POA: Diagnosis not present

## 2020-12-24 ENCOUNTER — Other Ambulatory Visit: Payer: Self-pay

## 2020-12-24 ENCOUNTER — Emergency Department (HOSPITAL_COMMUNITY): Payer: BC Managed Care – PPO

## 2020-12-24 ENCOUNTER — Emergency Department (HOSPITAL_COMMUNITY)
Admission: EM | Admit: 2020-12-24 | Discharge: 2020-12-24 | Disposition: A | Discharge status: Home / Self Care | Payer: BC Managed Care – PPO | Attending: Emergency Medicine | Admitting: Emergency Medicine

## 2020-12-24 ENCOUNTER — Encounter (HOSPITAL_COMMUNITY): Payer: Self-pay | Admitting: Emergency Medicine

## 2020-12-24 DIAGNOSIS — S52502A Unspecified fracture of the lower end of left radius, initial encounter for closed fracture: Secondary | ICD-10-CM | POA: Insufficient documentation

## 2020-12-24 DIAGNOSIS — S52552A Other extraarticular fracture of lower end of left radius, initial encounter for closed fracture: Secondary | ICD-10-CM

## 2020-12-24 DIAGNOSIS — S6992XA Unspecified injury of left wrist, hand and finger(s), initial encounter: Secondary | ICD-10-CM | POA: Diagnosis not present

## 2020-12-24 DIAGNOSIS — W19XXXA Unspecified fall, initial encounter: Secondary | ICD-10-CM | POA: Diagnosis not present

## 2020-12-24 DIAGNOSIS — S52592A Other fractures of lower end of left radius, initial encounter for closed fracture: Secondary | ICD-10-CM | POA: Diagnosis not present

## 2020-12-24 DIAGNOSIS — M25532 Pain in left wrist: Secondary | ICD-10-CM | POA: Diagnosis not present

## 2020-12-24 DIAGNOSIS — R52 Pain, unspecified: Secondary | ICD-10-CM

## 2020-12-24 MED ORDER — OXYCODONE-ACETAMINOPHEN 5-325 MG PO TABS
2.0000 | ORAL_TABLET | Freq: Once | ORAL | Status: AC
  Administered 2020-12-24: 2 via ORAL
  Filled 2020-12-24: qty 2

## 2020-12-24 MED ORDER — OXYCODONE-ACETAMINOPHEN 5-325 MG PO TABS: 1.0000 | ORAL_TABLET | Freq: Four times a day (QID) | ORAL | 0 refills | Status: DC | PRN

## 2020-12-24 MED ORDER — LIDOCAINE HCL (PF) 2 % IJ SOLN
INTRAMUSCULAR | Status: AC
  Administered 2020-12-24: 20 mL
  Filled 2020-12-24: qty 40

## 2020-12-24 MED ORDER — LIDOCAINE HCL (PF) 1 % IJ SOLN
INTRAMUSCULAR | Status: AC
  Filled 2020-12-24: qty 20

## 2020-12-24 MED ORDER — LIDOCAINE HCL (PF) 2 % IJ SOLN: 20.0000 mL | Freq: Once | INTRAMUSCULAR | Status: AC

## 2020-12-24 NOTE — ED Provider Notes (Signed)
[facility] EMERGENCY DEPARTMENT Provider Note  CSN: [id number] Arrival date & time: 12/24/20 1443    History Chief Complaint  Patient presents with   Wrist Pain    BRAXON SUDER is a 60 y.o. male fell off the back of his trailer today landing on his outstretched L hand. Complaining of moderate aching pain to L wrist, worse with movement. No other injuries reported.    Past Medical History:  Diagnosis Date   Allergy    Arthritis    Back pain    chronic   GERD (gastroesophageal reflux disease)    Kidney stones     Past Surgical History:  Procedure Laterality Date   EYE SURGERY     HERNIA REPAIR      Family History  Problem Relation Age of Onset   Heart disease Mother    Hyperlipidemia Mother    Hypertension Mother    Cancer Father    Heart disease Father    Hyperlipidemia Father    Hypertension Father    Stroke Father    Diabetes Sister    Heart disease Sister    Hyperlipidemia Sister    Hypertension Sister    Hyperlipidemia Brother    Diabetes Brother    Heart disease Maternal Grandmother    Hyperlipidemia Maternal Grandmother    Cancer Maternal Grandfather    Heart disease Maternal Grandfather    Hyperlipidemia Maternal Grandfather    Hypertension Maternal Grandfather    Heart disease Paternal Grandmother    Hyperlipidemia Paternal Grandmother    Hypertension Paternal Grandmother    Cancer Paternal Grandfather     Social History   Tobacco Use   Smoking status: Never   Smokeless tobacco: Never  Vaping Use   Vaping Use: Never used  Substance Use Topics   Alcohol use: No   Drug use: No     Home Medications Prior to Admission medications   Medication Sig Start Date End Date Taking? Authorizing Provider  albuterol (VENTOLIN HFA) 108 (90 Base) MCG/ACT inhaler Inhale into the lungs. 05/15/18   [provider]  amLODipine (NORVASC) 2.5 MG tablet Take 1 tablet (2.5 mg total) by mouth daily. 03/03/19 06/01/19  Pricilla Riffle, MD  amLODipine  (NORVASC) 2.5 MG tablet     [provider]  Chlorzoxazone 375 MG TABS Take 375 mg by mouth as needed.    [provider]  levocetirizine (XYZAL) 5 MG tablet Take 5 mg by mouth daily. 06/23/20   [provider]  levothyroxine (SYNTHROID) 50 MCG tablet TAKE ONE TABLET BY MOUTH ONCE DAILY. 08/16/19   Corum, Minerva Fester, MD  Mefenamic Acid 250 MG CAPS Take 250 mg by mouth 2 (two) times daily. Patient not taking: No sig reported    [provider]  penicillin v potassium (VEETID) 250 MG/5ML solution Take 250 mg by mouth daily.    [provider]     Allergies    Hydrocodone   Review of Systems   Review of Systems  Musculoskeletal:  Positive for arthralgias and joint swelling.  Neurological:  Negative for weakness and numbness.    Physical Exam BP 134/88   Pulse 68   Temp 98.1 F (36.7 C) (Oral)   Resp 18   Ht 5\' 11"  (1.803 m)   Wt (!) 138.8 kg   SpO2 97%   BMI 42.68 kg/m   Physical Exam Vitals and nursing note reviewed.  HENT:     Head: Normocephalic.     Nose:  Nose normal.  Eyes:     Extraocular Movements: Extraocular movements intact.  Pulmonary:     Effort: Pulmonary effort is normal.  Musculoskeletal:        General: Swelling and tenderness present.     Cervical back: Neck supple.     Comments: L wrist, pain with ROM, NVI  Skin:    Findings: No rash (on exposed skin).  Neurological:     Mental Status: He is alert and oriented to person, place, and time.  Psychiatric:        Mood and Affect: Mood normal.     ED Results / Procedures / Treatments   Labs (all labs ordered are listed, but only abnormal results are displayed) Labs Reviewed - No data to display  EKG None  Radiology No results found.  Procedures .Ortho Injury Treatment  Date/Time: 12/24/2020 5:15 PM Performed by: Pollyann Savoy, MD Authorized by: Pollyann Savoy, MD   Consent:    Consent obtained:  Verbal   Consent given by:  Patient   Risks  discussed:  Fracture   Alternatives discussed:  No treatment and immobilizationInjury location: wrist Location details: left wrist Injury type: fracture Fracture type: distal radius Pre-procedure distal perfusion: normal Pre-procedure neurological function: normal Pre-procedure range of motion: reduced Anesthesia: hematoma block  Anesthesia: Local anesthesia used: yes Local Anesthetic: lidocaine 2% without epinephrine Anesthetic total: 20 mL  Patient sedated: NoManipulation performed: yes Reduction successful: yes X-ray confirmed reduction: yes Immobilization: splint Splint type: sugar tong Splint Applied by: ED Nurse and ED Provider Supplies used: Ortho-Glass Post-procedure neurovascular assessment: post-procedure neurovascularly intact Post-procedure distal perfusion: normal Post-procedure neurological function: normal Post-procedure range of motion: unchanged    Medications Ordered in the ED Medications - No data to display   MDM Rules/Calculators/A&P MDM Fall on outstretched hand, will check xray of L wrist. Suspect fracture. Patient declines pain medications at the time of my evaluation.   ED Course  I have reviewed the triage vital signs and the nursing notes.  Pertinent labs & imaging results that were available during my care of the patient were reviewed by me and considered in my medical decision making (see chart for details).  Clinical Course as of 12/24/20 1806  Wynelle Link Dec 24, 2020  1534 Xray images reviewed. Will discuss with Ortho/Hand on call.  [CS]  1607 Dr. Dallas Schimke, Ortho has reviewed images, recommends reduction, splinting and outpatient follow up. Oral pain meds, plan hematoma block and reduction/splinting.  [CS]    Clinical Course User Index [CS] Pollyann Savoy, MD    Final Clinical Impression(s) / ED Diagnoses Final diagnoses:  None    Rx / DC Orders ED Discharge Orders     None        Pollyann Savoy, MD 12/24/20 1806

## 2020-12-24 NOTE — ED Triage Notes (Signed)
Pt fell backwards off of a bumper on a trailer landing on his left wrist.

## 2020-12-25 ENCOUNTER — Telehealth (HOSPITAL_COMMUNITY): Payer: Self-pay | Admitting: Student

## 2020-12-25 MED ORDER — OXYCODONE-ACETAMINOPHEN 5-325 MG PO TABS: 1.0000 | ORAL_TABLET | Freq: Four times a day (QID) | ORAL | 0 refills | Status: DC | PRN

## 2020-12-25 NOTE — Telephone Encounter (Signed)
I received a call today in the emergency department stating that the patient did not receive his opioid medication and this was confirmed with [facility].  This encounter is being created to send the appropriate prescription to the pharmacy.

## 2020-12-27 ENCOUNTER — Ambulatory Visit: Payer: BC Managed Care – PPO | Admitting: Orthopedic Surgery

## 2020-12-27 ENCOUNTER — Encounter: Payer: Self-pay | Admitting: Orthopedic Surgery

## 2020-12-27 ENCOUNTER — Other Ambulatory Visit: Payer: Self-pay

## 2020-12-27 VITALS — BP 129/89 | HR 62 | Ht 71.0 in | Wt 308.0 lb

## 2020-12-27 DIAGNOSIS — Z6841 Body Mass Index (BMI) 40.0 and over, adult: Secondary | ICD-10-CM

## 2020-12-27 DIAGNOSIS — S52532A Colles' fracture of left radius, initial encounter for closed fracture: Secondary | ICD-10-CM

## 2020-12-27 NOTE — Progress Notes (Signed)
New Patient Visit  Assessment: Eugene Kelley is a 60 y.o. male with the following: 1. Closed Colles' fracture of left radius, initial encounter  Plan: Radiographs reviewed with the patient in clinic today.  Postreduction x-rays demonstrates adequate alignment.  As this is his nondominant hand, he think he will do well with nonoperative management.  Should this displace further, in the next week, we may have to consider a closed reduction in the operating room.  This was discussed with the patient and all questions were answered.  Continue with current splint.  Elevate is much as possible to improve the swelling.  Contact clinic with any issues.  Follow-up in a week.   The patient meets the AMA guidelines for Morbid obesity with BMI > 40.  The patient has been counseled on weight loss.    Follow-up: Return in about 1 week (around 01/03/2021).  Subjective:  Chief Complaint  Patient presents with   Wrist Injury    Lt wrist fx DOI 12/24/20    History of Present Illness: Eugene Kelley is a 60 y.o. RHD male who presents for evaluation of left wrist injury.  3 days ago, he fell off the back of a truck, bracing himself with his left wrist.  He noted immediate pain.  Limited motion.  He presented to the emergency department and was diagnosed with a distal radius fracture.  The distal radius fracture was reduced under general sedation, placed in a splint.  He presents today for follow-up.  He works as a Naval architect.  Previous injury to his bilateral thumbs.  He describes tingling in his left thumb.   Review of Systems: No fevers or chills Tingling in left thumb No chest pain No shortness of breath No bowel or bladder dysfunction No GI distress No headaches   Medical History:  Past Medical History:  Diagnosis Date   Allergy    Arthritis    Back pain    chronic   GERD (gastroesophageal reflux disease)    Kidney stones     Past Surgical History:  Procedure Laterality Date   EYE  SURGERY     HERNIA REPAIR      Family History  Problem Relation Age of Onset   Heart disease Mother    Hyperlipidemia Mother    Hypertension Mother    Cancer Father    Heart disease Father    Hyperlipidemia Father    Hypertension Father    Stroke Father    Diabetes Sister    Heart disease Sister    Hyperlipidemia Sister    Hypertension Sister    Hyperlipidemia Brother    Diabetes Brother    Heart disease Maternal Grandmother    Hyperlipidemia Maternal Grandmother    Cancer Maternal Grandfather    Heart disease Maternal Grandfather    Hyperlipidemia Maternal Grandfather    Hypertension Maternal Grandfather    Heart disease Paternal Grandmother    Hyperlipidemia Paternal Grandmother    Hypertension Paternal Grandmother    Cancer Paternal Grandfather    Social History   Tobacco Use   Smoking status: Never   Smokeless tobacco: Never  Vaping Use   Vaping Use: Never used  Substance Use Topics   Alcohol use: No   Drug use: No    Allergies  Allergen Reactions   Oxycodone Itching   Codeine Rash   Hydrocodone     Current Meds  Medication Sig   amLODipine (NORVASC) 2.5 MG tablet    Chlorzoxazone 375 MG TABS  Take 375 mg by mouth as needed.   levocetirizine (XYZAL) 5 MG tablet Take 5 mg by mouth daily.   levothyroxine (SYNTHROID) 50 MCG tablet TAKE ONE TABLET BY MOUTH ONCE DAILY.   oxyCODONE-acetaminophen (PERCOCET/ROXICET) 5-325 MG tablet Take 1 tablet by mouth every 6 (six) hours as needed for severe pain.   penicillin v potassium (VEETID) 250 MG/5ML solution Take 250 mg by mouth daily.    Objective: BP 129/89   Pulse 62   Ht 5\' 11"  (1.803 m)   Wt (!) 308 lb (139.7 kg)   BMI 42.96 kg/m   Physical Exam:  General: Alert and oriented., No acute distress., and Obese male.  Gait: Normal gait.  Left forearm is in a splint, encompassing the elbow.  Splint is clean, dry and intact.  Fingers are swollen.  Sensation is intact throughout the hand, with the exception  of a slightly decreased sensation to the left thumb.  He is able to wiggle all fingers.  No skin breakdown at the periphery.  No skin breakdown at the elbow.  Fingers are warm and well-perfused.  IMAGING: I personally reviewed images previously obtained from the ED  X-rays of the left wrist were obtained in the emergency department and demonstrates a displaced fracture, extra-articular.  Postreduction x-rays demonstrate improved alignment, with slightly dorsal angulation at the joint line.   New Medications:  No orders of the defined types were placed in this encounter.     , MD  12/27/2020 10:03 PM

## 2021-01-03 ENCOUNTER — Other Ambulatory Visit: Payer: Self-pay

## 2021-01-03 ENCOUNTER — Ambulatory Visit (INDEPENDENT_AMBULATORY_CARE_PROVIDER_SITE_OTHER): Payer: BC Managed Care – PPO | Admitting: Orthopedic Surgery

## 2021-01-03 ENCOUNTER — Encounter: Payer: Self-pay | Admitting: Orthopedic Surgery

## 2021-01-03 ENCOUNTER — Ambulatory Visit: Payer: BC Managed Care – PPO

## 2021-01-03 DIAGNOSIS — S52532D Colles' fracture of left radius, subsequent encounter for closed fracture with routine healing: Secondary | ICD-10-CM | POA: Diagnosis not present

## 2021-01-03 DIAGNOSIS — S52532A Colles' fracture of left radius, initial encounter for closed fracture: Secondary | ICD-10-CM

## 2021-01-03 MED ORDER — OXYCODONE HCL 5 MG PO TABS: 5.0000 mg | ORAL_TABLET | Freq: Four times a day (QID) | ORAL | 0 refills | Status: DC | PRN

## 2021-01-03 NOTE — Patient Instructions (Signed)
Splint modified in clinic today - return in one week for repeat XR and transition to a cast

## 2021-01-03 NOTE — Progress Notes (Signed)
Orthopaedic Clinic Return  Assessment: Eugene Kelley is a 60 y.o. RHD male with the following: Left distal radius fracture, extra-articular; nonoperative  Plan: Repeat radiographs were obtained today and demonstrate adequate alignment.  Splint was modified in clinic today, and the elbow was cut to allow more freedom.  We will see him back in 1 week for splint removal and transition to a cast.  New prescription for pain medication provided.  Elevate hand to improve pain and swelling  Meds ordered this encounter  Medications   oxyCODONE (ROXICODONE) 5 MG immediate release tablet    Sig: Take 1 tablet (5 mg total) by mouth every 6 (six) hours as needed for severe pain.    Dispense:  15 tablet    Refill:  0    Follow-up: Return in about 1 week (around 01/10/2021).   Subjective:  Chief Complaint  Patient presents with   Wrist Injury    Fx care//DOI 12/24/20    History of Present Illness: Eugene Kelley is a 60 y.o. male who returns to clinic for repeat evaluation of his left wrist injury.  He sustained a distal radius fracture 10 days ago.  He continues to have pain.  He has tolerated the splint well.  No numbness or tingling.    Review of Systems: No fevers or chills No numbness or tingling No chest pain No shortness of breath No bowel or bladder dysfunction No GI distress No headaches  Objective: There were no vitals taken for this visit.  Physical Exam:  Splint is clean, dry and intact.  Some sharp edges are irritating the skin distally.  No skin breakdown.  Sensation intact to exposed fingers.  Persistent swelling and bruising to left hand.  Fingers are warm and well perfused.   IMAGING: I personally ordered and reviewed the following images:  XR of the distal radius in a splint demonstrates maintenance of reduction.  Joint line in neutral position.  Mild displacement of ulnar styloid fracture.  No interval displacement.  No acute injuries.   Impression: left distal  radius fracture in acceptable alignment.   Oliver Barre, MD 01/03/2021 10:17 PM

## 2021-01-10 ENCOUNTER — Other Ambulatory Visit: Payer: Self-pay

## 2021-01-10 ENCOUNTER — Encounter: Payer: Self-pay | Admitting: Orthopedic Surgery

## 2021-01-10 ENCOUNTER — Ambulatory Visit: Payer: BC Managed Care – PPO

## 2021-01-10 ENCOUNTER — Ambulatory Visit (INDEPENDENT_AMBULATORY_CARE_PROVIDER_SITE_OTHER): Payer: BC Managed Care – PPO | Admitting: Orthopedic Surgery

## 2021-01-10 VITALS — BP 144/83 | HR 62 | Ht 69.0 in | Wt 299.2 lb

## 2021-01-10 DIAGNOSIS — S52532A Colles' fracture of left radius, initial encounter for closed fracture: Secondary | ICD-10-CM

## 2021-01-10 MED ORDER — OXYCODONE HCL 5 MG PO TABS: 5.0000 mg | ORAL_TABLET | Freq: Four times a day (QID) | ORAL | 0 refills | Status: AC | PRN

## 2021-01-10 MED ORDER — OXYCODONE HCL 5 MG PO TABS: 5.0000 mg | ORAL_TABLET | ORAL | 0 refills | Status: DC | PRN

## 2021-01-10 NOTE — Patient Instructions (Signed)

## 2021-01-10 NOTE — Addendum Note (Signed)
Addended by: Thane Edu A on: 01/10/2021 10:46 AM   Modules accepted: Orders

## 2021-01-10 NOTE — Progress Notes (Signed)
[facility] Clinic Return  Assessment: Eugene Kelley is a 60 y.o. RHD male with the following: Left distal radius fracture, extra-articular; nonoperative  Plan: Radiographs demonstrate acceptable alignment.  Continue with nonoperative management.  Elevate to improve swelling.  Work on finger range of motion to prevent stiffness.  Provided one more prescription for oxycodone, to be taken at night time only.  He stated his understanding.  Possible transition to removable splint at the next visit.    Cast application - left short arm cast   Verbal consent was obtained and the correct extremity was identified. A well padded, appropriately molded short arm cast was applied to the left arm Fingers remained warm and well perfused.   There were no sharp edges Patient tolerated the procedure well Cast care instructions were provided    Meds ordered this encounter  Medications   oxyCODONE (ROXICODONE) 5 MG immediate release tablet    Sig: Take 1 tablet (5 mg total) by mouth every 4 (four) hours as needed for up to 7 days.    Dispense:  15 tablet    Refill:  0    Follow-up: Return in about 2 weeks (around 01/24/2021).   Subjective:  Chief Complaint  Patient presents with   Arm Injury    Left distal radius fracture, extra-articular; nonoperative, DOI 12/24/20. Splint off, xray.    History of Present Illness: Eugene Kelley is a 60 y.o. male who returns to clinic for repeat evaluation of his left wrist injury.  He sustained a distal radius fracture almost 3 weeks ago.  He continues to have pain.  Swelling is improving.  His pain is improving.  He is only taking pain medication at night time.  He denies numbness and tingling.   Review of Systems: No fevers or chills No numbness or tingling No chest pain No shortness of breath No bowel or bladder dysfunction No GI distress No headaches  Objective: BP (!) 144/83   Pulse 62   Ht 5\' 9"  (1.753 m)   Wt 299 lb 3.2 oz (135.7 kg)    BMI 44.18 kg/m   Physical Exam:  Upon removal of the splint, skin is clean, dry and intact.  Residual ecchymosis volar wrist.  Fingers are warm and well perfused.  Sensation intact in the radial, median and ulnar nerves.  Active thumb extension, index finger flexion and finger abduction.   IMAGING: I personally ordered and reviewed the following images:  XR of the left wrist demonstrates maintenance of alignment of extra-articular distal radius fracture.  Joint line is neutral.  No interval displacement.  No acute injury.  Minimally displaced ulnar styloid fracture.  Impression: left extra-articular distal radius fracture in acceptable alignment   , MD 01/10/2021 9:47 AM

## 2021-01-19 ENCOUNTER — Telehealth: Payer: Self-pay | Admitting: Orthopedic Surgery

## 2021-01-19 NOTE — Telephone Encounter (Signed)
Called patient regarding forms from [employer], a new requester; aware of forms process, however, his first form was under our internal forms process; he will come to office to sign release for form to be completed.

## 2021-01-24 ENCOUNTER — Encounter: Payer: Self-pay | Admitting: Orthopedic Surgery

## 2021-01-24 ENCOUNTER — Ambulatory Visit: Payer: BC Managed Care – PPO

## 2021-01-24 ENCOUNTER — Other Ambulatory Visit: Payer: Self-pay

## 2021-01-24 ENCOUNTER — Ambulatory Visit (INDEPENDENT_AMBULATORY_CARE_PROVIDER_SITE_OTHER): Payer: BC Managed Care – PPO | Admitting: Orthopedic Surgery

## 2021-01-24 DIAGNOSIS — S52532A Colles' fracture of left radius, initial encounter for closed fracture: Secondary | ICD-10-CM

## 2021-01-24 NOTE — Progress Notes (Signed)
[facility]opa[facility] Clinic Return  Assessment: Eugene Kelley is a 60 y.o. RHD male with the following: Left distal radius fracture, extra-articular; nonoperative  Plan: Radiographs of the left wrist are stable.  We will continue with nonoperative management.  We will place him into another cast, for an additional 2 weeks of immobilization.  After that, anticipate that we will be able to transition into a removable wrist brace.  Until he has much better range of motion, he will not be able to return to work.  This was discussed in clinic today.  No additional pain medications were requested at today's visit.  He should continue Tylenol or ibuprofen as needed.   Cast application - left short arm cast   Verbal consent was obtained and the correct extremity was identified. A well padded, appropriately molded short arm cast was applied to the left arm Fingers remained warm and well perfused.   There were no sharp edges Patient tolerated the procedure well Cast care instructions were provided    Follow-up: Return in about 2 weeks (around 02/07/2021).   Subjective:  Chief Complaint  Patient presents with   closed Colles' fracture of left radius    Follow up//xray out of cast    History of Present Illness: Eugene Kelley is a 60 y.o. male who returns to clinic for repeat evaluation of his left wrist injury.  Injury was sustained a little over 1 month ago.  He has done well thus far.  He has tolerated the cast.  He has noticed improvements in swelling, as well as his pain.  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  He notes some stiffness in his fingers, but this is improving.   Review of Systems: No fevers or chills No numbness or tingling No chest pain No shortness of breath No bowel or bladder dysfunction No GI distress No headaches  Objective: There were no vitals taken for this visit.  Physical Exam:  Alert and oriented.  No acute  distress.  After removal of the cast, there is no evidence of skin breakdown.  Swelling is significantly improved.  No ecchymosis is appreciated.  He has intact sensation of the superficial radial, median and ulnar nerve distribution.  His fingers are stiff, but he is able to flex and extend all fingers.  Active motion intact in the AIN/PIN/U nerve distribution.  2+ radial pulse.  IMAGING: I personally ordered and reviewed the following images:  X-ray of the left wrist were obtained in clinic today, compared to previous x-rays.  The distal radius fracture alignment remains unchanged.  Joint line is neutral.  There has been some interval consolidation of the fracture site.  No interval worsening of the joint line.  No acute injuries are noted.  Impression: Healing left distal radius fracture in acceptable alignment.  Oliver Barre, MD 01/24/2021 4:56 PM

## 2021-01-24 NOTE — Patient Instructions (Signed)

## 2021-02-07 ENCOUNTER — Encounter: Payer: Self-pay | Admitting: Orthopedic Surgery

## 2021-02-07 ENCOUNTER — Ambulatory Visit: Payer: BC Managed Care – PPO

## 2021-02-07 ENCOUNTER — Other Ambulatory Visit: Payer: Self-pay

## 2021-02-07 ENCOUNTER — Ambulatory Visit (INDEPENDENT_AMBULATORY_CARE_PROVIDER_SITE_OTHER): Payer: BC Managed Care – PPO | Admitting: Orthopedic Surgery

## 2021-02-07 VITALS — Ht 69.0 in | Wt 299.0 lb

## 2021-02-07 DIAGNOSIS — S52532D Colles' fracture of left radius, subsequent encounter for closed fracture with routine healing: Secondary | ICD-10-CM

## 2021-02-07 NOTE — Patient Instructions (Signed)
Wrist Fracture Rehab Ask your health care provider which exercises are safe for you. Do exercises exactly as told by your health care provider and adjust them as directed. It is normal to feel mild stretching, pulling, tightness, or discomfort as you do these exercises. Stop right away if you feel sudden pain or your pain gets worse. Do not begin these exercises until told by your health care provider. Stretching and range-of-motion exercises These exercises warm up your muscles and joints and improve the movement and flexibility of your wrist and hand. These exercises also help to relieve pain,numbness, and tingling. Finger flexion and extension Sit or stand with your elbow at your side. Open and stretch your left / right fingers as wide as you can (extension). Hold this position for 10 seconds. Close your left / right fingers into a gentle fist (flexion). Hold this position for 10 seconds. Slowly return to the starting position. Repeat 10 times. Complete this exercise 1-2 times a day. Wrist flexion Bend your left / right elbow to a 90-degree angle (right angle) with your palm facing the floor. Bend your wrist forward so your fingers point toward the floor (flexion). Hold this position for 10 seconds. Slowly return to the starting position. Repeat 10 times. Complete this exercise 1-2 times a day. Wrist extension Bend your left / right elbow to a 90-degree angle (right angle) with your palm facing the floor. Bend your wrist backward so your fingers point toward the ceiling (extension). Hold this position for 10 seconds. Slowly return to the starting position. Repeat 10 times. Complete this exercise 1-2 times a day. Ulnar deviation Bend your left / right elbow to a 90-degree angle (right angle), and rest your forearm on a table with your palm facing down. Keeping your hand flat on the table, bend your left / right wrist toward your small finger (pinkie). This is ulnar deviation. Hold this  position for 10 seconds. Slowly return to the starting position. Repeat 10 times. Complete this exercise 1-2 times a day. Radial deviation Bend your left / right elbow to a 90-degree angle (right angle), and rest your forearm on a table with your palm facing down. Keeping your hand flat on the table, bend your left / right wrist toward your thumb. This is radial deviation. Hold this position for 10 seconds. Slowly return to the starting position. Repeat 10 times. Complete this exercise 1-2 times a day. Forearm rotation, supination Stand or sit with your left / right elbow bent to a 90-degree angle (right angle) at your side. Position your forearm so that the thumb is facing the ceiling (neutral position). Turn (rotate) your palm up toward the ceiling (supination), stopping when you feel a gentle stretch. Hold this position for 10 seconds. Slowly return to the starting position. Repeat 10 times. Complete this exercise 1-2 times a day. Forearm rotation, pronation Stand or sit with your left / right elbow bent to a 90-degree angle (right angle) at your side. Position your forearm so that the thumb is facing the ceiling (neutral position). Turn (rotate) your palm down toward the floor (pronation), stopping when you feel a gentle stretch. Hold this position for 10 seconds. Slowly return to the starting position. Repeat 10 times. Complete this exercise 1-2 times a day. Wrist flexion stretch  Extend your left / right arm in front of you and turn your palm down toward the floor. If told by your health care provider, bend your left / right arm to a 90-degree angle (  right angle) at your side. Using your uninjured hand, gently press over the back of your left / right hand to bend your wrist and fingers toward the floor (flexion). Go as far as you can to feel a stretch without causing pain. Hold this position for 10 seconds. Slowly return to the starting position. Repeat 10 times. Complete this  exercise 1-2 times a day. Wrist extension stretch  Extend your left / right arm in front of you and turn your palm up toward the ceiling. If told by your health care provider, bend your left / right arm to a 90-degree angle (right angle) at your side. Using your uninjured hand, gently press over the palm of your left / right hand to bend your wrist and fingers toward the floor (extension). Go as far as you can to feel a stretch without causing pain. Hold this position for 10 seconds. Slowly return to the starting position. Repeat 10 times. Complete this exercise 1-2 times a day. Forearm rotation stretch, supination Stand or sit with your arms at your sides. Bend your left / right elbow to a 90-degree angle (right angle). Using your uninjured hand, turn your left / right palm up toward the ceiling (assisted supination) until you feel a gentle stretch in the inside of your forearm. Hold this position for 10 seconds. Slowly return to the starting position. Repeat 10 times. Complete this exercise 1-2 times a day. Forearm rotation stretch, pronation Stand or sit with your arms at your sides. Bend your left / right elbow to a 90-degree angle (right angle). Using your uninjured hand, turn your left / right palm down toward the floor (assisted pronation) until you feel a gentle stretch in the top of your forearm. Hold this position for 10 seconds. Slowly return to the starting position. Repeat 10 times. Complete this exercise 1-2 times a day. Strengthening exercises These exercises build strength and endurance in your wrist and hand. Enduranceis the ability to use your muscles for a long time, even after they get tired. Wrist flexion Sit with your left / right forearm supported on a table. Your elbow should be at waist height. Rest your hand over the edge of the table, palm up. Gently grasp a 5 lb / kg weight (can of soup). Or, hold an exercise band or tube in both hands, keeping your hands at  the same level and hip distance apart. There should be slight tension in the exercise band or tube. Without moving your forearm or elbow, slowly bend your wrist up toward the ceiling (wrist flexion). Hold this position for 10 seconds. Slowly return to the starting position. Repeat 10 times. Complete this exercise 1-2 times a day. Wrist extension Sit with your left / right forearm supported on a table. Your elbow should be at waist height. Rest your hand over the edge of the table, palm down. Gently grasp a 5 lb / kg weight. Or, hold an exercise band or tube in both hands, keeping your hands at the same level and hip distance apart. There should be slight tension in the exercise band or tube. Without moving your forearm or elbow, slowly curl your hand up toward the ceiling (extension). Hold this position for 10 seconds. Slowly return to the starting position. Repeat 10 times. Complete this exercise 1-2 times a day. Forearm rotation, supination  Sit with your left / right forearm supported on a table. Your elbow should be at waist height. Rest your hand over the edge of the   table, palm down. Gently grasp a lightweight hammer near the head. As this exercise gets easier for you, try holding the hammer farther down the handle. Without moving your elbow, slowly turn (rotate) your palm up toward the ceiling (supination). Hold this position for 10 seconds. Slowly return to the starting position. Repeat 10 times. Complete this exercise 1-2 times a day. Forearm rotation, pronation  Sit with your left / right forearm supported on a table. Your elbow should be at waist height. Rest your hand over the edge of the table, palm up. Gently grasp a lightweight hammer near the head. As this exercise gets easier for you, try holding the hammer farther down the handle. Without moving your elbow, slowly turn (rotate) your palm down toward the floor (pronation). Hold this position for 10 seconds. Slowly return  to the starting position. Repeat 10 times. Complete this exercise 1-2 times a day. Grip strengthening  Hold one of these items in your left / right hand: a dense sponge, a stress ball, or a large, rolled sock. Slowly squeeze the object as hard as you can without increasing any pain. Hold your squeeze for 10 seconds. Slowly release your grip. Repeat 10 times. Complete this exercise 1-2 times a day. This information is not intended to replace advice given to you by your health care provider. Make sure you discuss any questions you have with your healthcare provider. Document Revised: 08/19/2019 Document Reviewed: 08/19/2019 Elsevier Patient Education  2022 [employer]  

## 2021-02-07 NOTE — Progress Notes (Signed)
[facility] Clinic Return  Assessment: Eugene Kelley is a 60 y.o. RHD male with the following: Left distal radius fracture, extra-articular; nonoperative  Plan: Cast removed, and x-rays are stable.  He has been immobilized for approximately 6 weeks.  We will allow him to initiate range of motion.  He provided with a removable wrist splint.  He is to wear this for the next 2 weeks, at all times, and then as needed.  It is okay for him to remove the splint to work on range of motion.  Exercises were provided for him.  He should avoid heavy lifting at this time.  We will see him back in 4 weeks for repeat evaluation.  He feels as though he can return to work before the next visit, I have asked him to contact the clinic for updated paperwork.   Follow-up: Return in about 4 weeks (around 03/07/2021).   Subjective:  Chief Complaint  Patient presents with   Fracture    Lt wrist DOI 12/24/20    History of Present Illness: Eugene Kelley is a 60 y.o. male who returns to clinic for repeat evaluation of his left wrist injury.  He sustained a distal radius fracture that has been treated in a cast.  Injury was about 6 weeks ago.  His pain is improved.  He continues to have some swelling, but this is improving.  No numbness or tingling.  He has tolerated the cast well.  Review of Systems: No fevers or chills No numbness or tingling No chest pain No shortness of breath No bowel or bladder dysfunction No GI distress No headaches  Objective: Ht 5\' 9"  (1.753 m)   Wt 299 lb (135.6 kg)   BMI 44.15 kg/m   Physical Exam:  Alert and oriented.  No acute distress.  After removal of the cast, he does have some dry skin.  Some residual swelling is noted distally.  He is unable to make a full fist.  Limited range of motion in his wrist due to pain.  Fingers are warm and well-perfused.  Sensations intact throughout the left hand.  IMAGING: I personally ordered and reviewed the following  images:  X-ray of the left wrist was obtained in clinic today, compared to previous x-rays.  There is interval consolidation at the distal radius fracture site.  There is neutral radial tilt.  Joint spaces well-maintained.  Neutral radial height.  No interval displacement compared to previous x-rays.  Impression: Left extra-articular distal radius fracture in stable alignment  , MD 02/07/2021 10:22 PM

## 2021-02-13 DIAGNOSIS — R42 Dizziness and giddiness: Secondary | ICD-10-CM | POA: Diagnosis not present

## 2021-02-13 DIAGNOSIS — R0902 Hypoxemia: Secondary | ICD-10-CM | POA: Diagnosis not present

## 2021-02-13 DIAGNOSIS — R55 Syncope and collapse: Secondary | ICD-10-CM | POA: Diagnosis not present

## 2021-03-07 ENCOUNTER — Other Ambulatory Visit: Payer: Self-pay

## 2021-03-07 ENCOUNTER — Ambulatory Visit (INDEPENDENT_AMBULATORY_CARE_PROVIDER_SITE_OTHER): Payer: BC Managed Care – PPO | Admitting: Orthopedic Surgery

## 2021-03-07 ENCOUNTER — Encounter: Payer: Self-pay | Admitting: Orthopedic Surgery

## 2021-03-07 ENCOUNTER — Ambulatory Visit: Payer: BC Managed Care – PPO

## 2021-03-07 VITALS — Ht 69.0 in | Wt 299.0 lb

## 2021-03-07 DIAGNOSIS — S52532D Colles' fracture of left radius, subsequent encounter for closed fracture with routine healing: Secondary | ICD-10-CM

## 2021-03-07 NOTE — Patient Instructions (Signed)
Ok to return to work 03/08/21, please provide a letter.

## 2021-03-07 NOTE — Progress Notes (Signed)
Orthopaedic Clinic Return  Assessment: Eugene Kelley is a 60 y.o. RHD male with the following: Left distal radius fracture, extra-articular; nonoperative  Plan: His pain is improved.  He is happy with the improvements in his range of motion.  He feels comfortable going back to work.  I encouraged him to continue working on range of motion and strengthening of the left wrist.  He is done very well in his recovery.  He can return to work starting tomorrow, a letter was provided for him today.  Follow-up as needed.  Follow-up: Return if symptoms worsen or fail to improve.   Subjective:  Chief Complaint  Patient presents with   Fracture    Lt wrist fx care DOI 12/24/20    History of Present Illness: Eugene Kelley is a 60 y.o. male who returns to clinic for repeat evaluation of his left wrist injury.  He sustained an injury to his left wrist approximately 2.5-3 months ago.  He has stopped using the removable wrist brace.  He has no pain in his left wrist.  His range of motion has improved.  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  He feels ready to return to work.  Review of Systems: No fevers or chills No numbness or tingling No chest pain No shortness of breath No bowel or bladder dysfunction No GI distress No headaches  Objective: Ht 5\' 9"  (1.753 m)   Wt 299 lb (135.6 kg)   BMI 44.15 kg/m   Physical Exam:  Alert and oriented.  No acute distress.  Evaluation left wrist demonstrates no deformity.  No bruising is appreciated.  He has no swelling.  45 degrees of extension, 30 degrees of flexion.  Limited supination of the wrist.  Fingers are warm and well-perfused.   IMAGING: I personally ordered and reviewed the following images:  X-rays of the left wrist were obtained in clinic today, and compared to previous x-rays.  There is been no change in the alignment.  There has been interval  consolidation at the fracture.  Joint line remains in neutral alignment overall.  Impression: Left, extra-articular distal radius fracture in stable alignment  , MD 03/07/2021 11:22 AM

## 2021-04-27 ENCOUNTER — Telehealth: Payer: BC Managed Care – PPO | Admitting: Physician Assistant

## 2021-04-27 DIAGNOSIS — U071 COVID-19: Secondary | ICD-10-CM

## 2021-04-27 MED ORDER — MOLNUPIRAVIR EUA 200MG CAPSULE
4.0000 | ORAL_CAPSULE | Freq: Two times a day (BID) | ORAL | 0 refills | Status: AC
Start: 2021-04-27 — End: 2021-05-02

## 2021-04-27 MED ORDER — BENZONATATE 100 MG PO CAPS: 100.0000 mg | ORAL_CAPSULE | Freq: Three times a day (TID) | ORAL | 0 refills | Status: AC | PRN

## 2021-04-27 NOTE — Progress Notes (Signed)
Virtual Visit Consent   Eugene Kelley, you are scheduled for a virtual visit with a [facility] provider today.     Just as with appointments in the office, your consent must be obtained to participate.  Your consent will be active for this visit and any virtual visit you may have with one of our providers in the next 365 days.     If you have a MyChart account, a copy of this consent can be sent to you electronically.  All virtual visits are billed to your insurance company just like a traditional visit in the office.    As this is a virtual visit, video technology does not allow for your provider to perform a traditional examination.  This may limit your provider's ability to fully assess your condition.  If your provider identifies any concerns that need to be evaluated in person or the need to arrange testing (such as labs, EKG, etc.), we will make arrangements to do so.     Although advances in technology are sophisticated, we cannot ensure that it will always work on either your end or our end.  If the connection with a video visit is poor, the visit may have to be switched to a telephone visit.  With either a video or telephone visit, we are not always able to ensure that we have a secure connection.     I need to obtain your verbal consent now.   Are you willing to proceed with your visit today?    Eugene Kelley has provided verbal consent on 04/27/2021 for a virtual visit (video or telephone).   Margaretann Loveless, PA-C   Date: 04/27/2021 12:02 PM   Virtual Visit via Video Note   I, Margaretann Loveless, connected with  Eugene Kelley  ([id number], [birth year]) on 04/27/21 at 12:00 PM EST by a video-enabled telemedicine application and verified that I am speaking with the correct person using two identifiers.  Location: Patient: Virtual Visit Location Patient: Home Provider: Virtual Visit Location Provider: Home Office   I discussed the limitations of evaluation and management by  telemedicine and the availability of in person appointments. The patient expressed understanding and agreed to proceed.    History of Present Illness: Eugene Kelley is a 61 y.o. who identifies as a male who was assigned male at birth, and is being seen today for Covid 62.  HPI: URI  This is a new problem. Episode onset: Tested positive for Covid 19 Yesterday; Symptoms started Wednesday. The problem has been gradually worsening. There has been no fever. Associated symptoms include congestion, coughing (from drainage), rhinorrhea, sinus pain and a sore throat (mild, but improving). Pertinent negatives include no diarrhea, ear pain, headaches, nausea, plugged ear sensation or vomiting. Associated symptoms comments: Post nasal drainage, fatigue. Treatments tried: cough syrup, cough drops, nebulizers. The treatment provided mild relief.     Problems:  Patient Active Problem List   Diagnosis Date Noted   Fatigue 12/14/2018   Chronic back pain 12/14/2018   Short of breath on exertion 12/14/2018   Dental caries 12/14/2018    Allergies:  Allergies  Allergen Reactions   Oxycodone Itching   Codeine Rash   Hydrocodone    Medications:  Current Outpatient Medications:    benzonatate (TESSALON) 100 MG capsule, Take 1 capsule (100 mg total) by mouth 3 (three) times daily as needed., Disp: 30 capsule, Rfl: 0   molnupiravir EUA (LAGEVRIO) 200 mg CAPS capsule, Take 4 capsules (800  mg total) by mouth 2 (two) times daily for 5 days., Disp: 40 capsule, Rfl: 0   albuterol (VENTOLIN HFA) 108 (90 Base) MCG/ACT inhaler, Inhale into the lungs., Disp: , Rfl:    amLODipine (NORVASC) 2.5 MG tablet, Take 1 tablet (2.5 mg total) by mouth daily., Disp: 90 tablet, Rfl: 3   amLODipine (NORVASC) 2.5 MG tablet, , Disp: , Rfl:    Chlorzoxazone 375 MG TABS, Take 375 mg by mouth as needed., Disp: , Rfl:    levocetirizine (XYZAL) 5 MG tablet, Take 5 mg by mouth daily., Disp: , Rfl:    levothyroxine (SYNTHROID) 50 MCG  tablet, TAKE ONE TABLET BY MOUTH ONCE DAILY., Disp: 30 tablet, Rfl: 0   Mefenamic Acid 250 MG CAPS, Take 250 mg by mouth 2 (two) times daily., Disp: , Rfl:    penicillin v potassium (VEETID) 250 MG/5ML solution, Take 250 mg by mouth daily., Disp: , Rfl:    Semaglutide,0.25 or 0.5MG /DOS, (OZEMPIC, 0.25 OR 0.5 MG/DOSE,) 2 MG/1.5ML SOPN, Inject 0.25 mg into the skin once a week., Disp: , Rfl:   Observations/Objective: Patient is well-developed, well-nourished in no acute distress.  Resting comfortably at home.  Head is normocephalic, atraumatic.  No labored breathing.  Speech is clear and coherent with logical content.  Patient is alert and oriented at baseline.    Assessment and Plan: 1. COVID-19 - molnupiravir EUA (LAGEVRIO) 200 mg CAPS capsule; Take 4 capsules (800 mg total) by mouth 2 (two) times daily for 5 days.  Dispense: 40 capsule; Refill: 0 - benzonatate (TESSALON) 100 MG capsule; Take 1 capsule (100 mg total) by mouth 3 (three) times daily as needed.  Dispense: 30 capsule; Refill: 0  - Continue OTC symptomatic management of choice - Will send OTC vitamins and supplement information through AVS - Molnupiravir and tessalon prescribed - Patient enrolled in MyChart symptom monitoring - Push fluids - Rest as needed - Discussed return precautions and when to seek in-person evaluation, sent via AVS as well  Follow Up Instructions: I discussed the assessment and treatment plan with the patient. The patient was provided an opportunity to ask questions and all were answered. The patient agreed with the plan and demonstrated an understanding of the instructions.  A copy of instructions were sent to the patient via MyChart unless otherwise noted below.    The patient was advised to call back or seek an in-person evaluation if the symptoms worsen or if the condition fails to improve as anticipated.  Time:  I spent 11 minutes with the patient via telehealth technology discussing the above  problems/concerns.    Margaretann Loveless, PA-C

## 2021-04-27 NOTE — Patient Instructions (Signed)
Eugene Kelley, thank you for joining Margaretann Loveless, PA-C for today's virtual visit.  While this provider is not your primary care provider (PCP), if your PCP is located in our provider database this encounter information will be shared with them immediately following your visit.  Consent: (Patient) Eugene Kelley provided verbal consent for this virtual visit at the beginning of the encounter.  Current Medications:  Current Outpatient Medications:    benzonatate (TESSALON) 100 MG capsule, Take 1 capsule (100 mg total) by mouth 3 (three) times daily as needed., Disp: 30 capsule, Rfl: 0   molnupiravir EUA (LAGEVRIO) 200 mg CAPS capsule, Take 4 capsules (800 mg total) by mouth 2 (two) times daily for 5 days., Disp: 40 capsule, Rfl: 0   albuterol (VENTOLIN HFA) 108 (90 Base) MCG/ACT inhaler, Inhale into the lungs., Disp: , Rfl:    amLODipine (NORVASC) 2.5 MG tablet, Take 1 tablet (2.5 mg total) by mouth daily., Disp: 90 tablet, Rfl: 3   amLODipine (NORVASC) 2.5 MG tablet, , Disp: , Rfl:    Chlorzoxazone 375 MG TABS, Take 375 mg by mouth as needed., Disp: , Rfl:    levocetirizine (XYZAL) 5 MG tablet, Take 5 mg by mouth daily., Disp: , Rfl:    levothyroxine (SYNTHROID) 50 MCG tablet, TAKE ONE TABLET BY MOUTH ONCE DAILY., Disp: 30 tablet, Rfl: 0   Mefenamic Acid 250 MG CAPS, Take 250 mg by mouth 2 (two) times daily., Disp: , Rfl:    penicillin v potassium (VEETID) 250 MG/5ML solution, Take 250 mg by mouth daily., Disp: , Rfl:    Semaglutide,0.25 or 0.5MG /DOS, (OZEMPIC, 0.25 OR 0.5 MG/DOSE,) 2 MG/1.5ML SOPN, Inject 0.25 mg into the skin once a week., Disp: , Rfl:    Medications ordered in this encounter:  Meds ordered this encounter  Medications   molnupiravir EUA (LAGEVRIO) 200 mg CAPS capsule    Sig: Take 4 capsules (800 mg total) by mouth 2 (two) times daily for 5 days.    Dispense:  40 capsule    Refill:  0    Order Specific Question:   Supervising Provider    Answer:   MILLER, BRIAN  [[id number]]   benzonatate (TESSALON) 100 MG capsule    Sig: Take 1 capsule (100 mg total) by mouth 3 (three) times daily as needed.    Dispense:  30 capsule    Refill:  0    Order Specific Question:   Supervising Provider    Answer:   Hyacinth Meeker, BRIAN [[id number]]     *If you need refills on other medications prior to your next appointment, please contact your pharmacy*  Follow-Up: Call back or seek an in-person evaluation if the symptoms worsen or if the condition fails to improve as anticipated.  Other Instructions Molnupiravir Oral Capsules What is this medication? MOLNUPIRAVIR (mol nue pir a vir) treats COVID-19. It is an antiviral medication. It may decrease the risk of developing severe symptoms of COVID-19. It may also decrease the chance of going to the hospital. This medication is not approved by the FDA. The FDA has authorized emergency use of this medication during the COVID-19 pandemic. This medicine may be used for other purposes; ask your health care provider or pharmacist if you have questions. COMMON BRAND NAME(S): LAGEVRIO What should I tell my care team before I take this medication? They need to know if you have any of these conditions: Any allergies Any serious illness An unusual or allergic reaction to molnupiravir, other medications, foods, dyes,  or preservatives Pregnant or trying to get pregnant Breast-feeding How should I use this medication? Take this medication by mouth with water. Take it as directed on the prescription label at the same time every day. Do not cut, crush or chew this medication. Swallow the capsules whole. You can take it with or without food. If it upsets your stomach, take it with food. Take all of this medication unless your care team tells you to stop it early. Keep taking it even if you think you are better. Talk to your care team about the use of this medication in children. Special care may be needed. Overdosage: If you think you have taken too much  of this medicine contact a poison control center or emergency room at once. NOTE: This medicine is only for you. Do not share this medicine with others. What if I miss a dose? If you miss a dose, take it as soon as you can unless it is more than 10 hours late. If it is more than 10 hours late, skip the missed dose. Take the next dose at the normal time. Do not take extra or 2 doses at the same time to make up for the missed dose. What may interact with this medication? Interactions have not been studied. This list may not describe all possible interactions. Give your health care provider a list of all the medicines, herbs, non-prescription drugs, or dietary supplements you use. Also tell them if you smoke, drink alcohol, or use illegal drugs. Some items may interact with your medicine. What should I watch for while using this medication? Your condition will be monitored carefully while you are receiving this medication. Visit your care team for regular checkups. Tell your care team if your symptoms do not start to get better or if they get worse. Do not become pregnant while taking this medication. You may need a pregnancy test before starting this medication. Women must use a reliable form of birth control while taking this medication and for 4 days after stopping the medication. Women should inform their care team if they wish to become pregnant or think they might be pregnant. Men should not father a child while taking this medication and for 3 months after stopping it. There is potential for serious harm to an unborn child. Talk to your care team for more information. Do not breast-feed an infant while taking this medication and for 4 days after stopping the medication. What side effects may I notice from receiving this medication? Side effects that you should report to your care team as soon as possible: Allergic reactions--skin rash, itching, hives, swelling of the face, lips, tongue, or  throat Side effects that usually do not require medical attention (report these to your care team if they continue or are bothersome): Diarrhea Dizziness Nausea This list may not describe all possible side effects. Call your doctor for medical advice about side effects. You may report side effects to FDA at 1-800-FDA-1088. Where should I keep my medication? Keep out of the reach of children and pets. Store at room temperature between 20 and 25 degrees C (68 and 77 degrees F). Get rid of any unused medication after the expiration date. To get rid of medications that are no longer needed or have expired: Take the medication to a medication take-back program. Check with your pharmacy or law enforcement to find a location. If you cannot return the medication, check the label or package insert to see if the medication should  be thrown out in the garbage or flushed down the toilet. If you are not sure, ask your care team. If it is safe to put it in the trash, take the medication out of the container. Mix the medication with cat litter, dirt, coffee grounds, or other unwanted substance. Seal the mixture in a bag or container. Put it in the trash. NOTE: This sheet is a summary. It may not cover all possible information. If you have questions about this medicine, talk to your doctor, pharmacist, or health care provider.  2022 Elsevier/Gold Standard (2020-04-17 00:00:00)   10 Things You Can Do to Manage Your COVID-19 Symptoms at Home If you have possible or confirmed COVID-19 Stay home except to get medical care. Monitor your symptoms carefully. If your symptoms get worse, call your healthcare provider immediately. Get rest and stay hydrated. If you have a medical appointment, call the healthcare provider ahead of time and tell them that you have or may have COVID-19. For medical emergencies, call 911 and notify the dispatch personnel that you have or may have COVID-19. Cover your cough and sneezes  with a tissue or use the inside of your elbow. Wash your hands often with soap and water for at least 20 seconds or clean your hands with an alcohol-based hand sanitizer that contains at least 60% alcohol. As much as possible, stay in a specific room and away from other people in your home. Also, you should use a separate bathroom, if available. If you need to be around other people in or outside of the home, wear a mask. Avoid sharing personal items with other people in your household, like dishes, towels, and bedding. Clean all surfaces that are touched often, like counters, tabletops, and doorknobs. Use household cleaning sprays or wipes according to the label instructions. [employer].gov/coronavirus 11/05/2019 This information is not intended to replace advice given to you by your health care provider. Make sure you discuss any questions you have with your health care provider. Document Revised: 12/29/2020 Document Reviewed: 12/29/2020 Elsevier Patient Education  2022 [employer]    If you have been instructed to have an in-person evaluation today at a local Urgent Care facility, please use the link below. It will take you to a list of all of our available [facility], including address, phone number and hours of operation. Please do not delay care.  [facility]  If you or a family member do not have a primary care provider, use the link below to schedule a visit and establish care. When you choose a [facility] primary care physician or advanced practice provider, you gain a long-term partner in health. Find a Primary Care Provider  Learn more about [facility]'s in-office and virtual care options: [facility] - Get Care Now

## 2021-06-04 DIAGNOSIS — E785 Hyperlipidemia, unspecified: Secondary | ICD-10-CM | POA: Diagnosis not present

## 2021-06-04 DIAGNOSIS — E039 Hypothyroidism, unspecified: Secondary | ICD-10-CM | POA: Diagnosis not present

## 2021-06-04 DIAGNOSIS — R7301 Impaired fasting glucose: Secondary | ICD-10-CM | POA: Diagnosis not present

## 2021-06-04 DIAGNOSIS — Z125 Encounter for screening for malignant neoplasm of prostate: Secondary | ICD-10-CM | POA: Diagnosis not present

## 2021-06-12 DIAGNOSIS — Z0001 Encounter for general adult medical examination with abnormal findings: Secondary | ICD-10-CM | POA: Diagnosis not present

## 2021-06-18 ENCOUNTER — Other Ambulatory Visit (HOSPITAL_COMMUNITY): Payer: Self-pay

## 2021-06-18 MED ORDER — WEGOVY 0.5 MG/0.5ML ~~LOC~~ SOAJ
SUBCUTANEOUS | 1 refills | Status: AC
  Filled 2021-06-18 – 2021-06-20 (×2): qty 2, 28d supply, fill #0
  Filled 2021-07-15: qty 2, 28d supply, fill #1
  Filled 2021-08-13: qty 2, 28d supply, fill #2
  Filled 2021-09-12: qty 2, 28d supply, fill #3

## 2021-06-19 ENCOUNTER — Other Ambulatory Visit (HOSPITAL_COMMUNITY): Payer: Self-pay

## 2021-06-20 ENCOUNTER — Other Ambulatory Visit (HOSPITAL_COMMUNITY): Payer: Self-pay

## 2021-07-15 ENCOUNTER — Other Ambulatory Visit (HOSPITAL_COMMUNITY): Payer: Self-pay

## 2021-07-16 ENCOUNTER — Other Ambulatory Visit (HOSPITAL_COMMUNITY): Payer: Self-pay

## 2021-08-14 ENCOUNTER — Other Ambulatory Visit (HOSPITAL_COMMUNITY): Payer: Self-pay

## 2021-09-06 DIAGNOSIS — E039 Hypothyroidism, unspecified: Secondary | ICD-10-CM | POA: Diagnosis not present

## 2021-09-12 ENCOUNTER — Other Ambulatory Visit (HOSPITAL_COMMUNITY): Payer: Self-pay

## 2021-09-13 ENCOUNTER — Other Ambulatory Visit (HOSPITAL_COMMUNITY): Payer: Self-pay

## 2021-09-13 DIAGNOSIS — I10 Essential (primary) hypertension: Secondary | ICD-10-CM | POA: Diagnosis not present

## 2021-09-13 DIAGNOSIS — B009 Herpesviral infection, unspecified: Secondary | ICD-10-CM | POA: Diagnosis not present

## 2021-09-13 DIAGNOSIS — K219 Gastro-esophageal reflux disease without esophagitis: Secondary | ICD-10-CM | POA: Diagnosis not present

## 2021-09-13 MED ORDER — SILDENAFIL CITRATE 100 MG PO TABS
ORAL_TABLET | ORAL | 2 refills | Status: DC
  Filled 2021-09-13: qty 10, 30d supply, fill #0
  Filled 2021-11-25: qty 10, 30d supply, fill #1
  Filled 2021-12-31: qty 10, 30d supply, fill #2

## 2021-09-13 MED ORDER — WEGOVY 1 MG/0.5ML ~~LOC~~ SOAJ
SUBCUTANEOUS | 2 refills | Status: DC
  Filled 2021-09-18: qty 2, 28d supply, fill #0
  Filled 2021-10-29: qty 2, 28d supply, fill #1
  Filled 2021-11-25: qty 2, 28d supply, fill #2

## 2021-09-18 ENCOUNTER — Other Ambulatory Visit (HOSPITAL_COMMUNITY): Payer: Self-pay

## 2021-09-21 ENCOUNTER — Other Ambulatory Visit (HOSPITAL_COMMUNITY): Payer: Self-pay

## 2021-09-21 MED ORDER — PENICILLIN V POTASSIUM 500 MG PO TABS
ORAL_TABLET | ORAL | 2 refills | Status: DC
Start: 2021-09-21 — End: 2022-04-24
  Filled 2021-09-21: qty 50, 25d supply, fill #0
  Filled 2021-11-25: qty 50, 25d supply, fill #1
  Filled 2021-12-31: qty 50, 25d supply, fill #2

## 2021-10-09 ENCOUNTER — Other Ambulatory Visit (HOSPITAL_COMMUNITY): Payer: Self-pay

## 2021-10-29 ENCOUNTER — Other Ambulatory Visit (HOSPITAL_COMMUNITY): Payer: Self-pay

## 2021-11-26 ENCOUNTER — Other Ambulatory Visit (HOSPITAL_COMMUNITY): Payer: Self-pay

## 2021-11-28 ENCOUNTER — Other Ambulatory Visit (HOSPITAL_COMMUNITY): Payer: Self-pay

## 2021-12-12 DIAGNOSIS — E039 Hypothyroidism, unspecified: Secondary | ICD-10-CM | POA: Diagnosis not present

## 2021-12-12 DIAGNOSIS — E785 Hyperlipidemia, unspecified: Secondary | ICD-10-CM | POA: Diagnosis not present

## 2021-12-12 DIAGNOSIS — R7301 Impaired fasting glucose: Secondary | ICD-10-CM | POA: Diagnosis not present

## 2021-12-19 DIAGNOSIS — K219 Gastro-esophageal reflux disease without esophagitis: Secondary | ICD-10-CM | POA: Diagnosis not present

## 2021-12-19 DIAGNOSIS — I10 Essential (primary) hypertension: Secondary | ICD-10-CM | POA: Diagnosis not present

## 2021-12-19 DIAGNOSIS — B009 Herpesviral infection, unspecified: Secondary | ICD-10-CM | POA: Diagnosis not present

## 2021-12-31 ENCOUNTER — Other Ambulatory Visit (HOSPITAL_COMMUNITY): Payer: Self-pay

## 2022-01-01 ENCOUNTER — Other Ambulatory Visit (HOSPITAL_COMMUNITY): Payer: Self-pay

## 2022-01-01 MED ORDER — WEGOVY 1 MG/0.5ML ~~LOC~~ SOAJ
1.0000 mg | SUBCUTANEOUS | 2 refills | Status: DC
  Filled 2022-01-01: qty 2, 28d supply, fill #0
  Filled 2022-01-27: qty 2, 28d supply, fill #1
  Filled 2022-03-20: qty 2, 28d supply, fill #2

## 2022-01-27 ENCOUNTER — Other Ambulatory Visit (HOSPITAL_COMMUNITY): Payer: Self-pay

## 2022-01-28 ENCOUNTER — Other Ambulatory Visit (HOSPITAL_COMMUNITY): Payer: Self-pay

## 2022-01-28 MED ORDER — SILDENAFIL CITRATE 100 MG PO TABS
100.0000 mg | ORAL_TABLET | Freq: Every day | ORAL | 2 refills | Status: AC | PRN
  Filled 2022-01-28: qty 10, 10d supply, fill #0
  Filled 2022-10-19: qty 10, 10d supply, fill #1

## 2022-02-01 ENCOUNTER — Other Ambulatory Visit (HOSPITAL_COMMUNITY): Payer: Self-pay

## 2022-02-20 ENCOUNTER — Other Ambulatory Visit (HOSPITAL_COMMUNITY): Payer: Self-pay

## 2022-03-20 ENCOUNTER — Other Ambulatory Visit (HOSPITAL_COMMUNITY): Payer: Self-pay

## 2022-04-15 IMAGING — DX DG WRIST COMPLETE 3+V*L*
3 series · 3 of 3 positions shown · non-contrast
Comparison: None.

CLINICAL DATA: Patient fell and landed on his left wrist.
Complaining of left wrist pain.

EXAM:
LEFT WRIST - COMPLETE 3+ VIEW

[wrist pa]
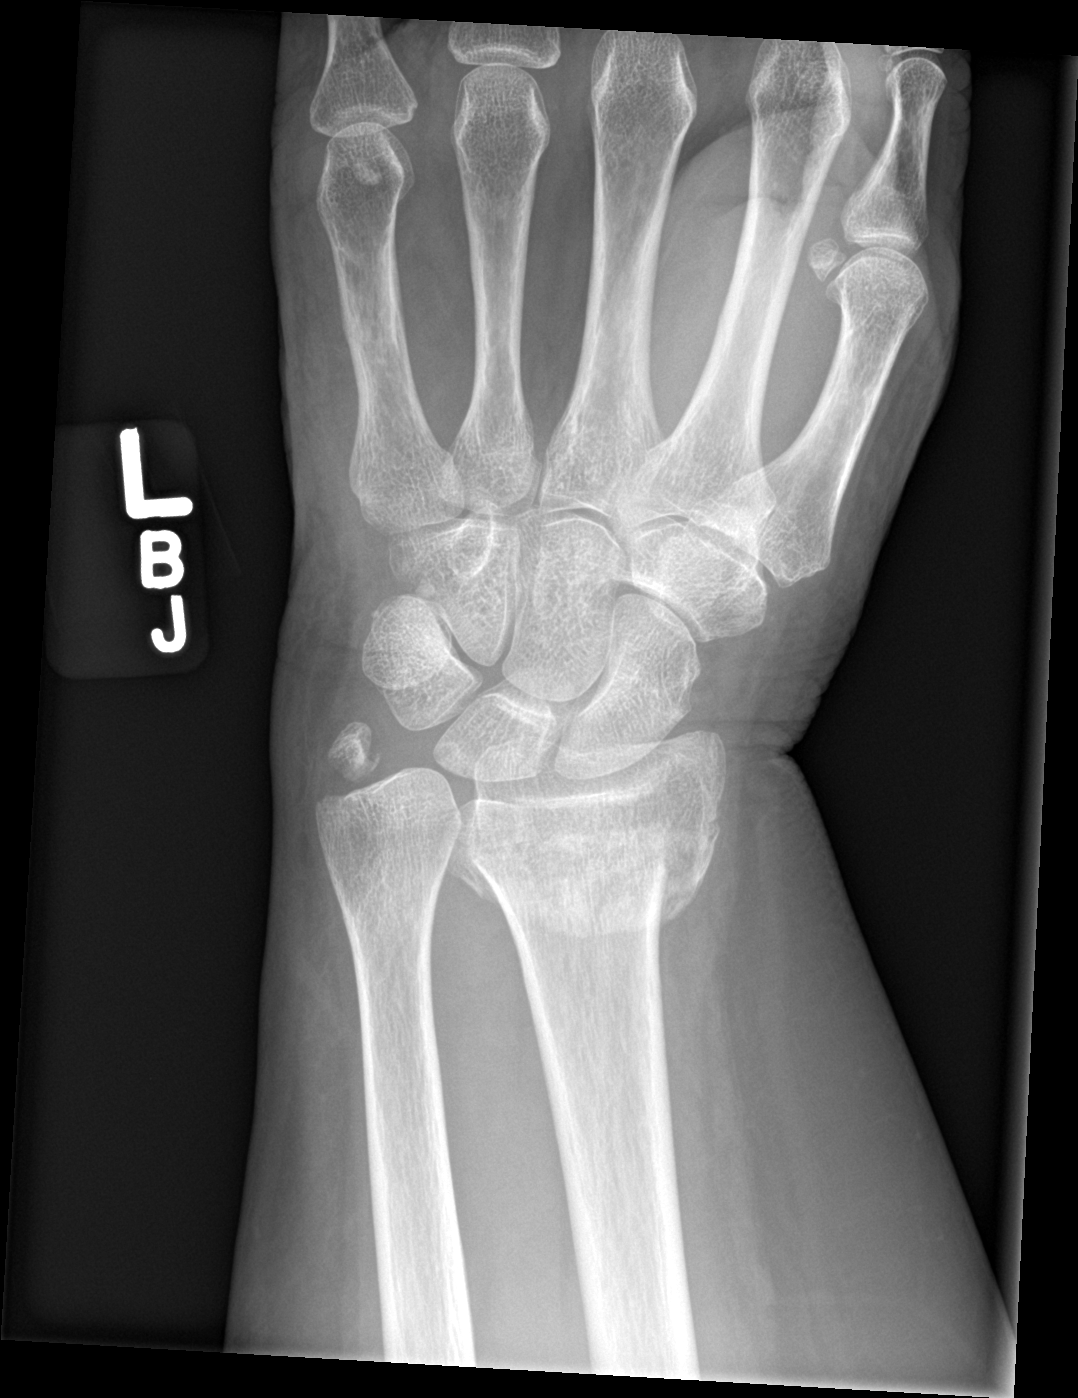

[wrist obl]
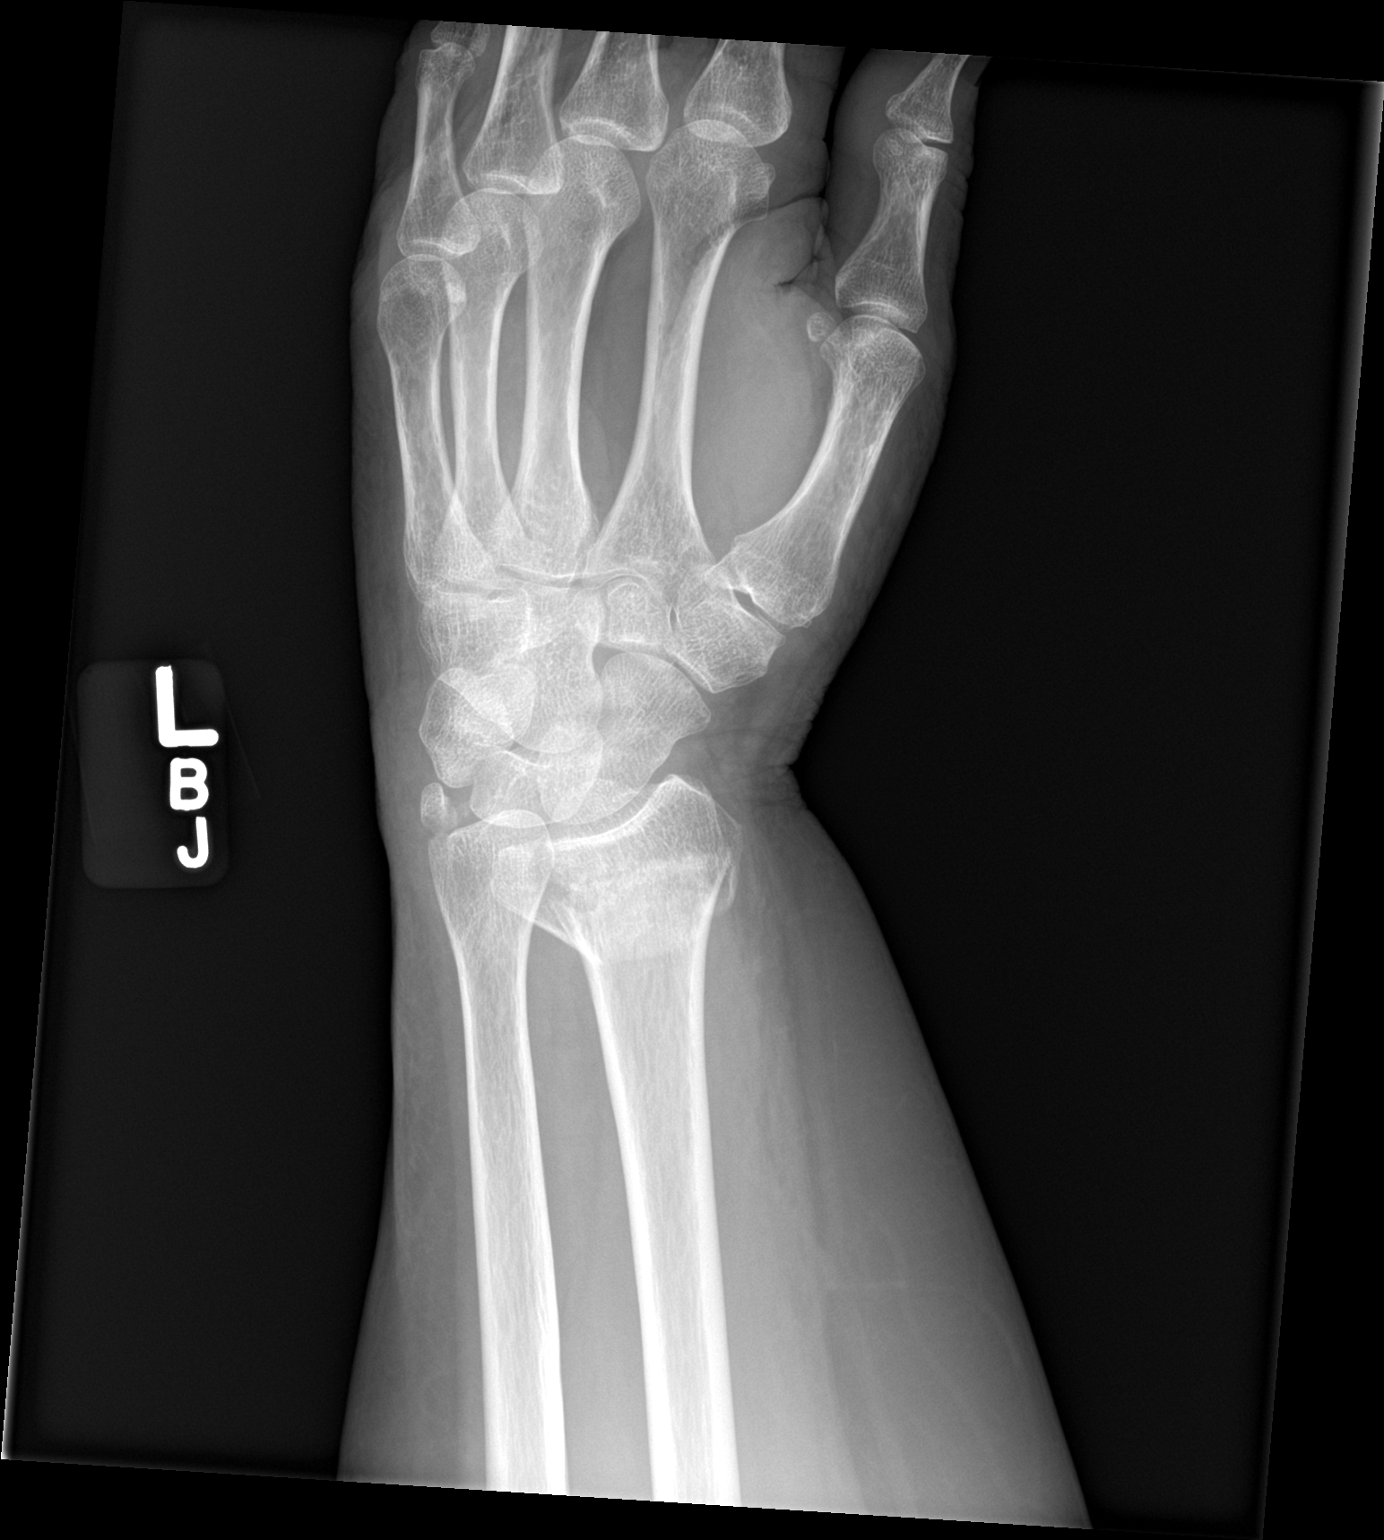

[wrist lat]
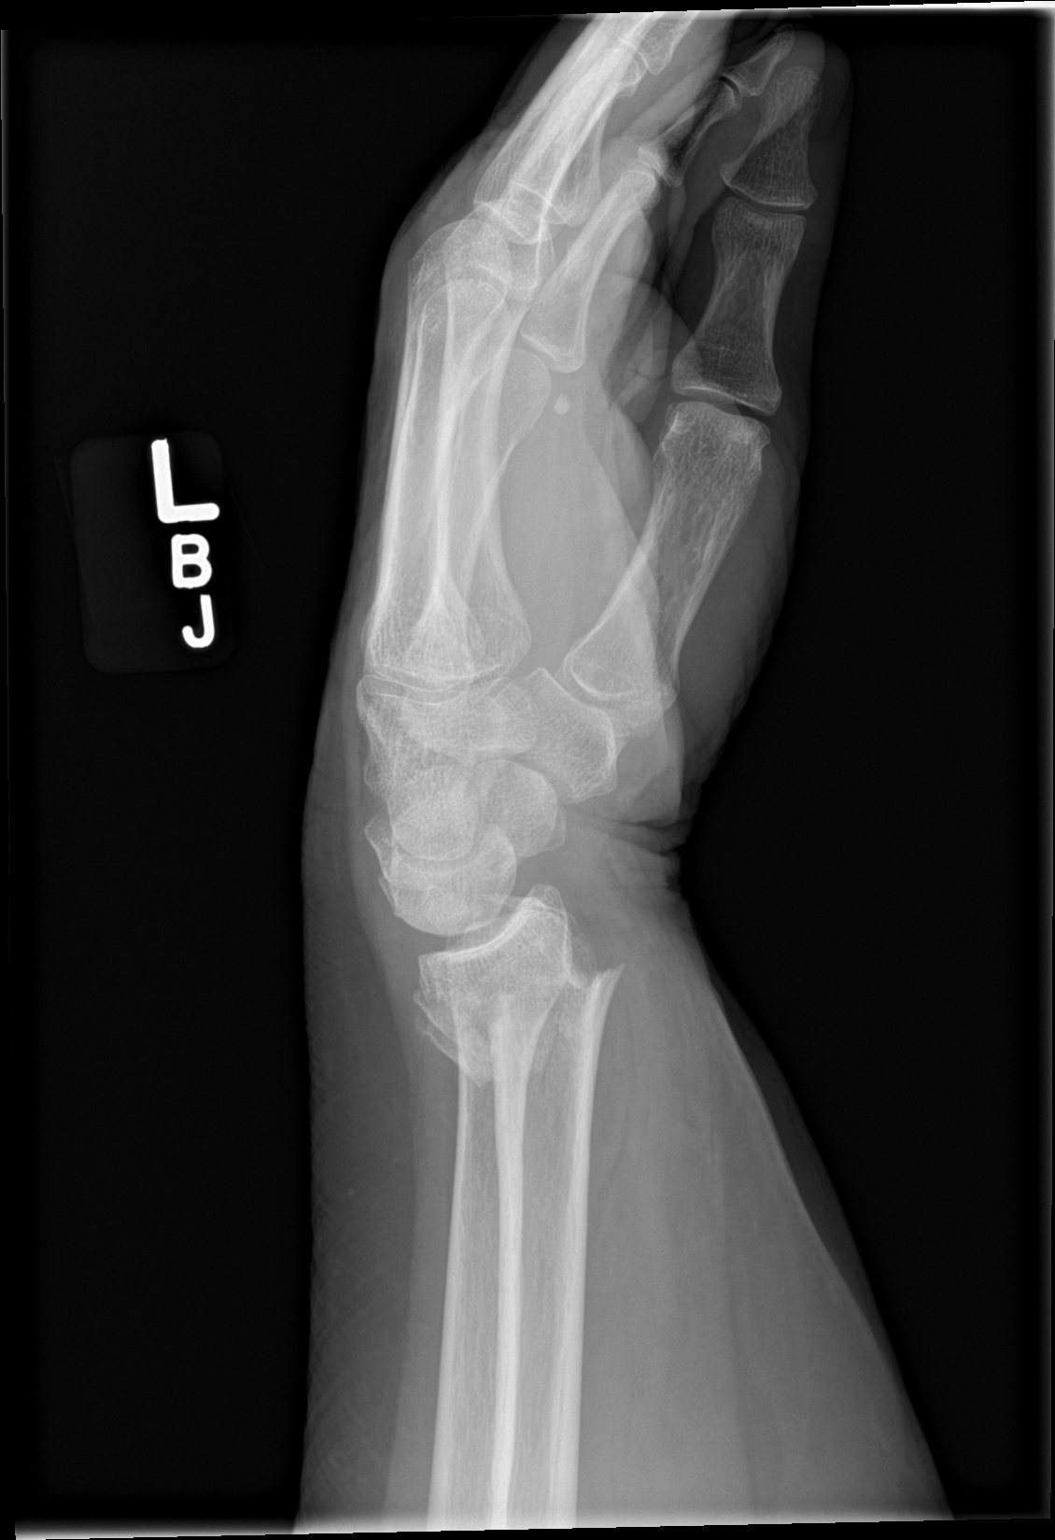

[3 of 3 positions shown; findings below may reference images not displayed]

FINDINGS: There is a displaced and angulated transverse fracture of the distal
radius. Age-indeterminate fracture of the ulnar styloid process. No
evidence of dislocation. There is regional soft tissue swelling.
IMPRESSION: 1. Displaced and angulated transverse fracture of the distal left
radius.
2. Age-indeterminate fracture of the ulnar styloid process.

## 2022-04-15 IMAGING — DX DG WRIST 2V*L*
2 series · 2 of 2 positions shown · non-contrast
Comparison: Left wrist radiographs 12/24/2020

CLINICAL DATA: Post reduction.  Fall on outstretched hand.

EXAM:
LEFT WRIST - 2 VIEW

[wrist lat]
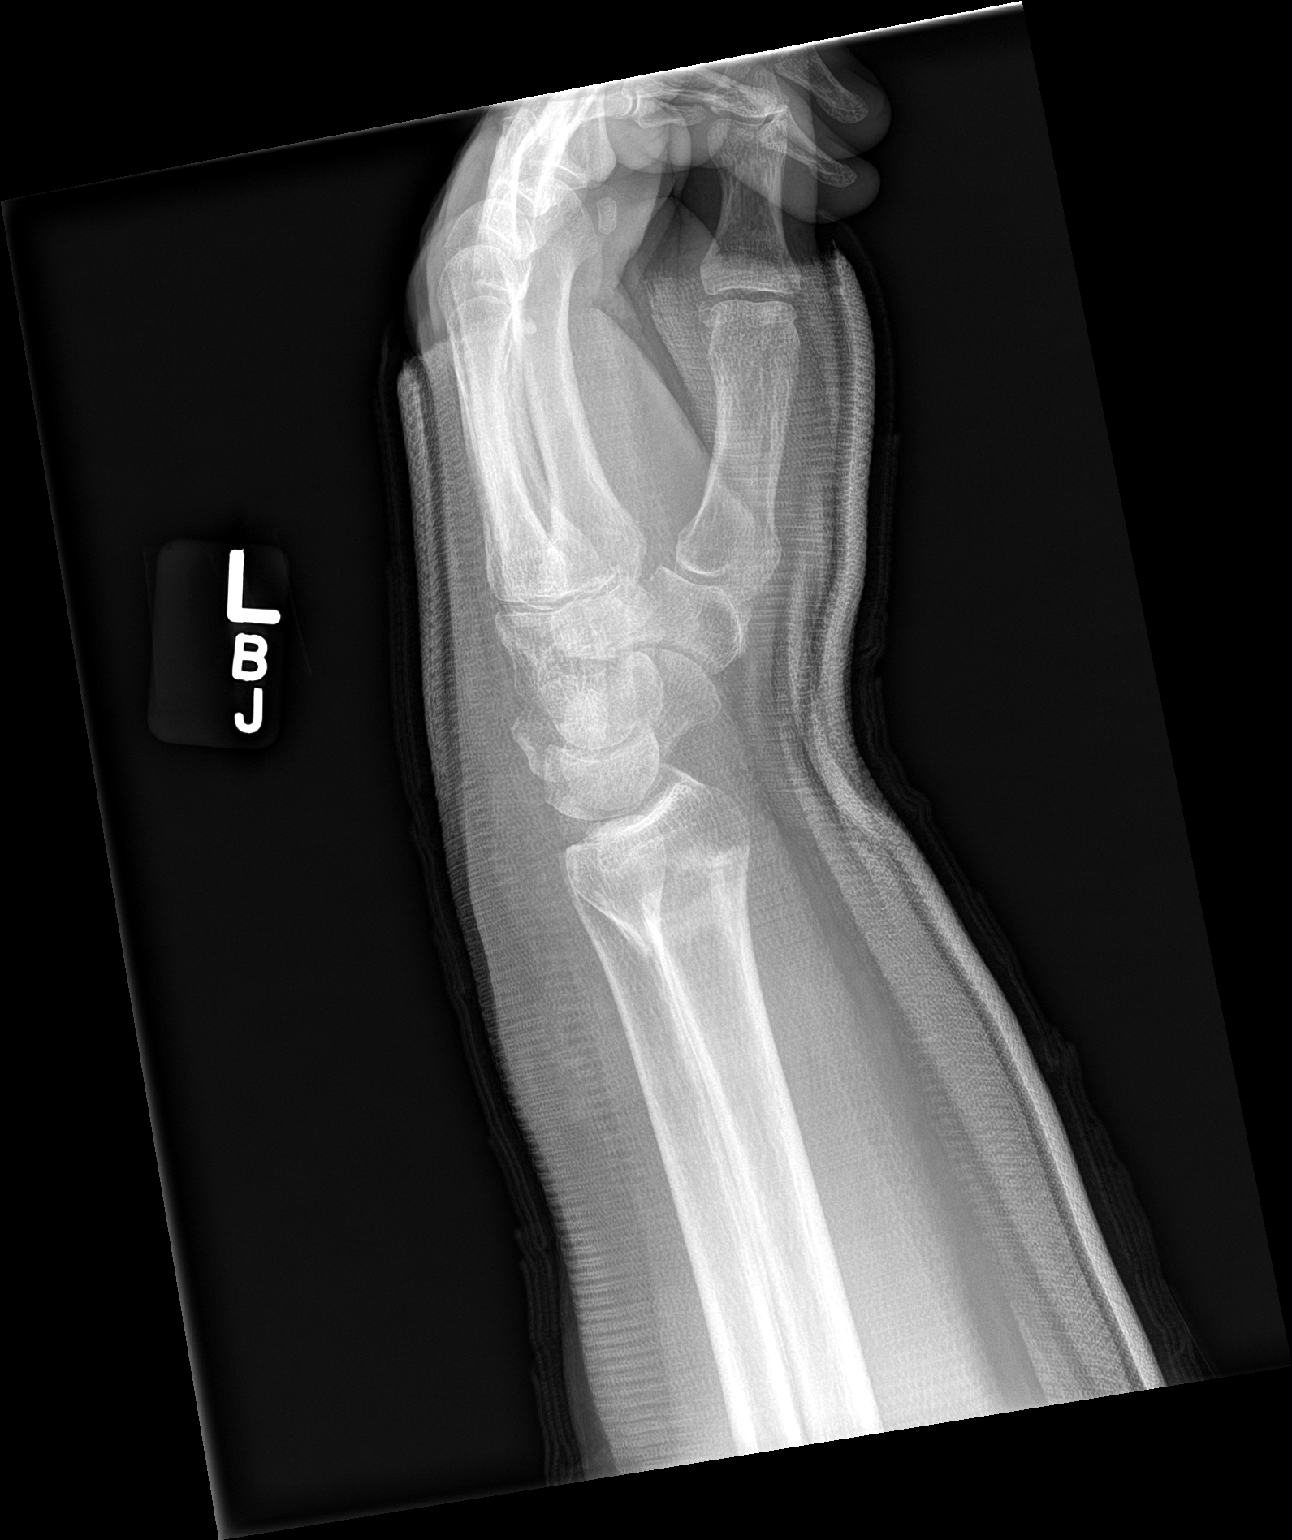

[wrist ap]
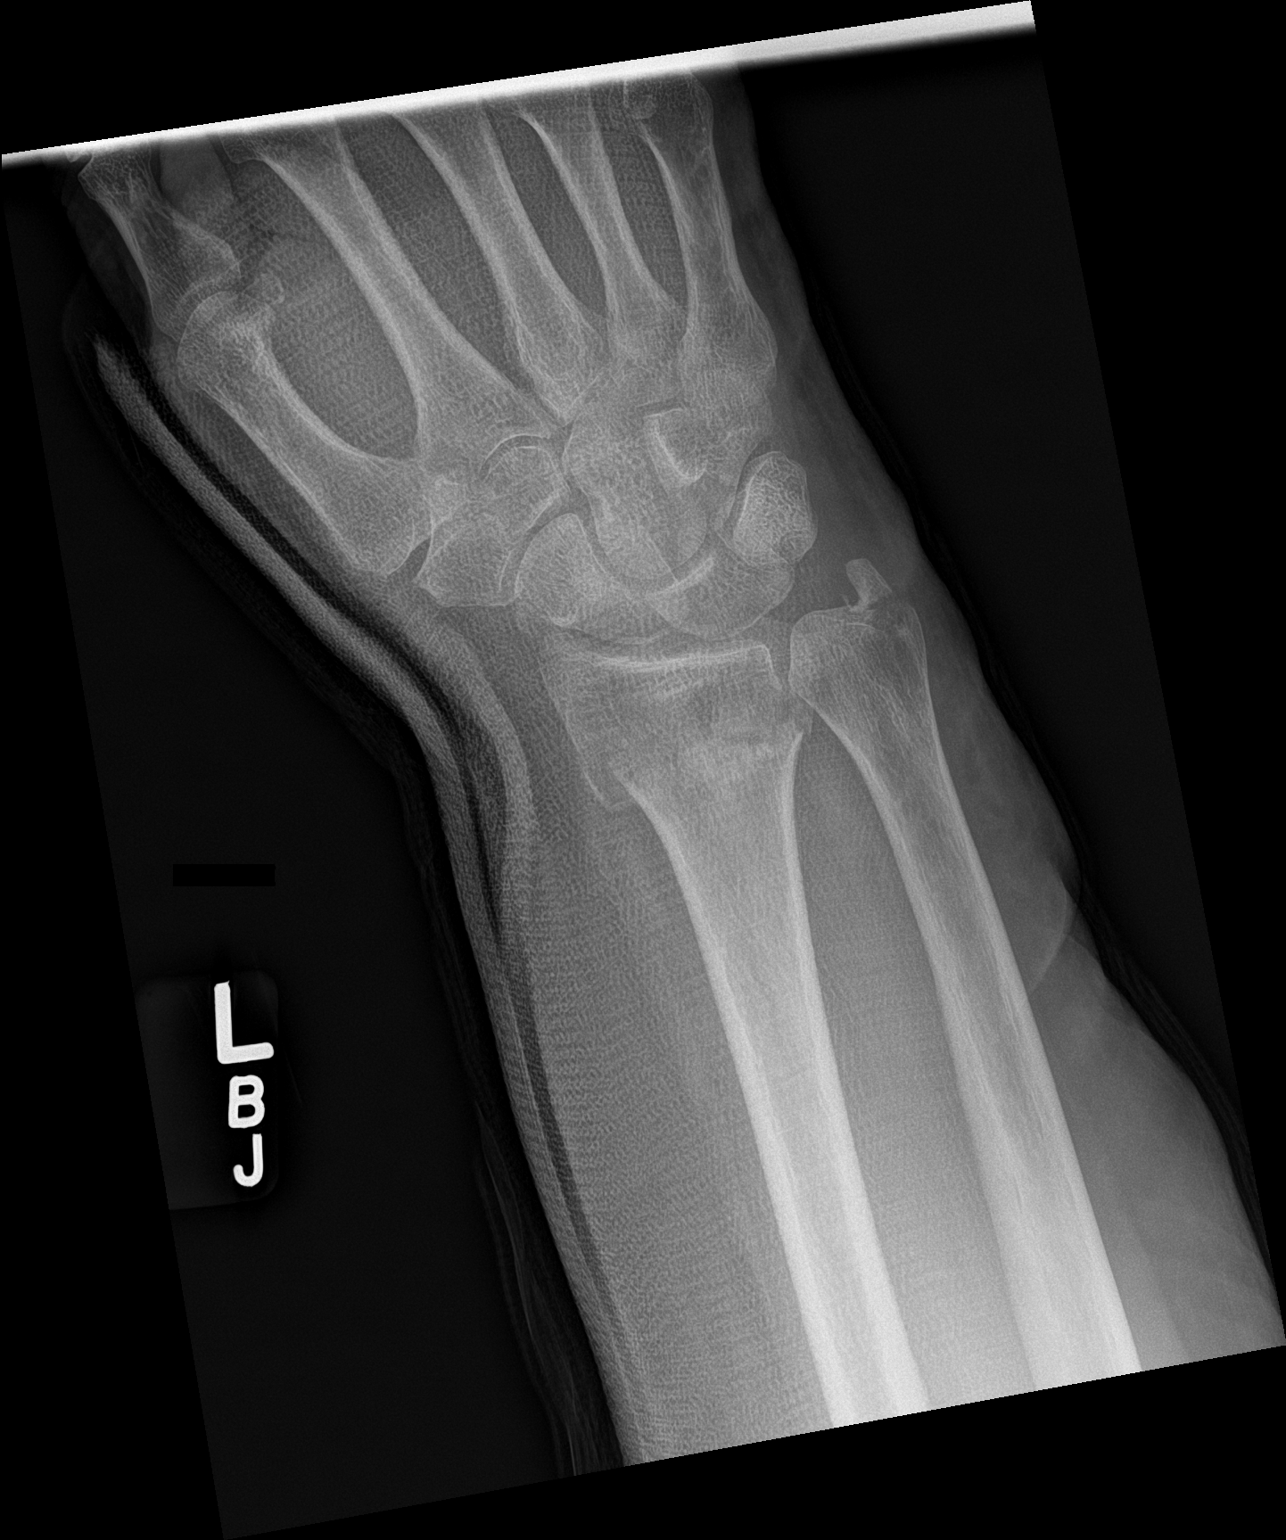

[2 of 2 positions shown; findings below may reference images not displayed]

FINDINGS: Interval reduction of a distal radial fracture with improved
fracture alignment. Redemonstrated ulnar styloid process fracture.
No new acute finding. There is now overlying casting material which
limits evaluation of the soft tissues.
IMPRESSION: Interval reduction of a distal radial fracture with improved
fracture alignment.

## 2022-04-22 ENCOUNTER — Other Ambulatory Visit (HOSPITAL_COMMUNITY): Payer: Self-pay

## 2022-04-22 ENCOUNTER — Ambulatory Visit
Admission: EM | Admit: 2022-04-22 | Discharge: 2022-04-22 | Disposition: A | Discharge status: Home / Self Care | Payer: BC Managed Care – PPO | Attending: Family Medicine | Admitting: Family Medicine

## 2022-04-22 DIAGNOSIS — Z1152 Encounter for screening for COVID-19: Secondary | ICD-10-CM | POA: Diagnosis not present

## 2022-04-22 DIAGNOSIS — R0602 Shortness of breath: Secondary | ICD-10-CM | POA: Insufficient documentation

## 2022-04-22 DIAGNOSIS — R059 Cough, unspecified: Secondary | ICD-10-CM | POA: Insufficient documentation

## 2022-04-22 DIAGNOSIS — J069 Acute upper respiratory infection, unspecified: Secondary | ICD-10-CM | POA: Diagnosis not present

## 2022-04-22 MED ORDER — PROMETHAZINE-DM 6.25-15 MG/5ML PO SYRP: 5.0000 mL | ORAL_SOLUTION | Freq: Four times a day (QID) | ORAL | 0 refills | Status: DC | PRN

## 2022-04-22 MED ORDER — DEXAMETHASONE SODIUM PHOSPHATE 10 MG/ML IJ SOLN
10.0000 mg | Freq: Once | INTRAMUSCULAR | Status: AC
  Administered 2022-04-22: 10 mg via INTRAMUSCULAR

## 2022-04-22 NOTE — ED Provider Notes (Signed)
[facility]    CSN: [id number] Arrival date & time: 04/22/22  1729      History   Chief Complaint Chief Complaint  Patient presents with   Cough   Nasal Congestion    HPI Eugene Kelley is a 62 y.o. male.   Patient presenting today with 4-day history of cough, congestion, chest tightness, shortness of breath.  Denies fever, chills, chest pain, abdominal pain, nausea vomiting or diarrhea.  States his home COVID test was negative.  Trying over-the-counter cold and congestion medications with minimal relief.  States he is prone to bronchitis.    Past Medical History:  Diagnosis Date   Allergy    Arthritis    Back pain    chronic   GERD (gastroesophageal reflux disease)    Kidney stones     Patient Active Problem List   Diagnosis Date Noted   Fatigue 12/14/2018   Chronic back pain 12/14/2018   Short of breath on exertion 12/14/2018   Dental caries 12/14/2018    Past Surgical History:  Procedure Laterality Date   EYE SURGERY     HERNIA REPAIR         Home Medications    Prior to Admission medications   Medication Sig Start Date End Date Taking? Authorizing Provider  albuterol (VENTOLIN HFA) 108 (90 Base) MCG/ACT inhaler Inhale into the lungs. 05/15/18  Yes [provider]  amLODipine (NORVASC) 2.5 MG tablet    Yes [provider]  Chlorzoxazone 375 MG TABS Take 375 mg by mouth as needed.   Yes [provider]  levocetirizine (XYZAL) 5 MG tablet Take 5 mg by mouth daily. 06/23/20  Yes [provider]  Mefenamic Acid 250 MG CAPS Take 250 mg by mouth 2 (two) times daily.   Yes [provider]  penicillin v potassium (VEETID) 250 MG/5ML solution Take 250 mg by mouth daily.   Yes [provider]  penicillin v potassium (VEETID) 500 MG tablet Take 1/2 tablet (250mg ) by mouth every 6 hours 09/21/21  Yes   promethazine-dextromethorphan (PROMETHAZINE-DM) 6.25-15 MG/5ML syrup Take 5 mLs by mouth 4 (four) times  daily as needed. 04/22/22  Yes Volney American, PA-C  Semaglutide-Weight Management [facility]) 0.5 MG/0.5ML SOAJ Inject 0.5 mg into the skin every week 06/18/21  Yes   Semaglutide-Weight Management (WEGOVY) 1 MG/0.5ML SOAJ Inject 1 mg into the skin once a week. 01/01/22  Yes   sildenafil (VIAGRA) 100 MG tablet Take 1 tablet by mouth daily if needed. 01/27/22  Yes   amLODipine (NORVASC) 2.5 MG tablet Take 1 tablet (2.5 mg total) by mouth daily. 03/03/19 01/03/21  Fay Records, MD  benzonatate (TESSALON) 100 MG capsule Take 1 capsule (100 mg total) by mouth 3 (three) times daily as needed. 04/27/21   Mar Daring, PA-C  levothyroxine (SYNTHROID) 50 MCG tablet TAKE ONE TABLET BY MOUTH ONCE DAILY. 08/16/19   Corum, Rex Kras, MD  Semaglutide,0.25 or 0.5MG /DOS, (OZEMPIC, 0.25 OR 0.5 MG/DOSE,) 2 MG/1.5ML SOPN Inject 0.25 mg into the skin once a week.    [provider]    Family History Family History  Problem Relation Age of Onset   Heart disease Mother    Hyperlipidemia Mother    Hypertension Mother    Cancer Father    Heart disease Father    Hyperlipidemia Father    Hypertension Father    Stroke Father    Diabetes Sister    Heart disease Sister    Hyperlipidemia  Sister    Hypertension Sister    Hyperlipidemia Brother    Diabetes Brother    Heart disease Maternal Grandmother    Hyperlipidemia Maternal Grandmother    Cancer Maternal Grandfather    Heart disease Maternal Grandfather    Hyperlipidemia Maternal Grandfather    Hypertension Maternal Grandfather    Heart disease Paternal Grandmother    Hyperlipidemia Paternal Grandmother    Hypertension Paternal Grandmother    Cancer Paternal Grandfather     Social History Social History   Tobacco Use   Smoking status: Never   Smokeless tobacco: Never  Vaping Use   Vaping Use: Never used  Substance Use Topics   Alcohol use: No   Drug use: No     Allergies   Oxycodone, Codeine, and Hydrocodone   Review of  Systems Review of Systems Per HPI  Physical Exam Triage Vital Signs ED Triage Vitals  Enc Vitals Group     BP 04/22/22 1912 106/72     Pulse Rate 04/22/22 1912 85     Resp 04/22/22 1912 16     Temp 04/22/22 1912 (!) 97.4 F (36.3 C)     Temp Source 04/22/22 1912 Oral     SpO2 04/22/22 1912 94 %     Weight --      Height --      Head Circumference --      Peak Flow --      Pain Score 04/22/22 1913 0     Pain Loc --      Pain Edu? --      Excl. in GC? --    No data found.  Updated Vital Signs BP 106/72 (BP Location: Right Arm)   Pulse 85   Temp (!) 97.4 F (36.3 C) (Oral)   Resp 16   SpO2 94%   Visual Acuity Right Eye Distance:   Left Eye Distance:   Bilateral Distance:    Right Eye Near:   Left Eye Near:    Bilateral Near:     Physical Exam Vitals and nursing note reviewed.  Constitutional:      Appearance: He is well-developed.  HENT:     Head: Atraumatic.     Right Ear: External ear normal.     Left Ear: External ear normal.     Nose: Rhinorrhea present.     Mouth/Throat:     Pharynx: Posterior oropharyngeal erythema present. No oropharyngeal exudate.  Eyes:     Conjunctiva/sclera: Conjunctivae normal.     Pupils: Pupils are equal, round, and reactive to light.  Cardiovascular:     Rate and Rhythm: Normal rate and regular rhythm.  Pulmonary:     Effort: Pulmonary effort is normal. No respiratory distress.     Breath sounds: No wheezing or rales.  Musculoskeletal:        General: Normal range of motion.     Cervical back: Normal range of motion and neck supple.  Lymphadenopathy:     Cervical: No cervical adenopathy.  Skin:    General: Skin is warm and dry.  Neurological:     Mental Status: He is alert and oriented to person, place, and time.  Psychiatric:        Behavior: Behavior normal.      UC Treatments / Results  Labs (all labs ordered are listed, but only abnormal results are displayed) Labs Reviewed  SARS CORONAVIRUS 2 (TAT 6-24  HRS)    EKG   Radiology No results found.  Procedures  Procedures (including critical care time)  Medications Ordered in UC Medications  dexamethasone (DECADRON) injection 10 mg (has no administration in time range)    Initial Impression / Assessment and Plan / UC Course  I have reviewed the triage vital signs and the nursing notes.  Pertinent labs & imaging results that were available during my care of the patient were reviewed by me and considered in my medical decision making (see chart for details).     Vitals and exam overall reassuring, suggestive of viral upper respiratory infection.  COVID test pending, treat with Phenergan DM, supportive over-the-counter medications.  I have Decadron given his propensity to bronchitis and concern about his cough.  Return for worsening symptoms.  Final Clinical Impressions(s) / UC Diagnoses   Final diagnoses:  Viral URI with cough   Discharge Instructions   None    ED Prescriptions     Medication Sig Dispense Auth. Provider   promethazine-dextromethorphan (PROMETHAZINE-DM) 6.25-15 MG/5ML syrup Take 5 mLs by mouth 4 (four) times daily as needed. 100 mL Volney American, [place]      PDMP not reviewed this encounter.   Volney American, [place] 04/22/22 1942

## 2022-04-22 NOTE — ED Triage Notes (Signed)
Cough, congestion, SOB, that started Friday. Taking OTC cold and flu medication.

## 2022-04-23 ENCOUNTER — Telehealth: Payer: Self-pay | Admitting: Emergency Medicine

## 2022-04-23 MED ORDER — PROMETHAZINE-DM 6.25-15 MG/5ML PO SYRP: 5.0000 mL | ORAL_SOLUTION | Freq: Four times a day (QID) | ORAL | 0 refills | Status: AC | PRN

## 2022-04-23 NOTE — Telephone Encounter (Signed)
Patient called and states [employer] did not receive his medication

## 2022-04-24 ENCOUNTER — Other Ambulatory Visit: Payer: Self-pay

## 2022-04-24 ENCOUNTER — Other Ambulatory Visit (HOSPITAL_COMMUNITY): Payer: Self-pay

## 2022-04-24 DIAGNOSIS — J019 Acute sinusitis, unspecified: Secondary | ICD-10-CM | POA: Diagnosis not present

## 2022-04-24 LAB — SARS CORONAVIRUS 2 (TAT 6-24 HRS): SARS Coronavirus 2: NEGATIVE

## 2022-04-24 MED ORDER — WEGOVY 1 MG/0.5ML ~~LOC~~ SOAJ
1.0000 mg | SUBCUTANEOUS | 2 refills | Status: DC
  Filled 2022-04-24: qty 2, 28d supply, fill #0
  Filled 2022-06-17: qty 2, 28d supply, fill #1
  Filled 2022-07-22: qty 2, 28d supply, fill #2

## 2022-05-02 ENCOUNTER — Other Ambulatory Visit (HOSPITAL_COMMUNITY): Payer: Self-pay

## 2022-06-17 ENCOUNTER — Other Ambulatory Visit (HOSPITAL_COMMUNITY): Payer: Self-pay

## 2022-06-17 DIAGNOSIS — R7301 Impaired fasting glucose: Secondary | ICD-10-CM | POA: Diagnosis not present

## 2022-06-17 DIAGNOSIS — Z125 Encounter for screening for malignant neoplasm of prostate: Secondary | ICD-10-CM | POA: Diagnosis not present

## 2022-06-17 DIAGNOSIS — E785 Hyperlipidemia, unspecified: Secondary | ICD-10-CM | POA: Diagnosis not present

## 2022-06-17 DIAGNOSIS — E039 Hypothyroidism, unspecified: Secondary | ICD-10-CM | POA: Diagnosis not present

## 2022-06-20 ENCOUNTER — Encounter: Payer: Self-pay | Admitting: Radiology

## 2022-06-24 DIAGNOSIS — I10 Essential (primary) hypertension: Secondary | ICD-10-CM | POA: Diagnosis not present

## 2022-06-24 DIAGNOSIS — B009 Herpesviral infection, unspecified: Secondary | ICD-10-CM | POA: Diagnosis not present

## 2022-06-24 DIAGNOSIS — K219 Gastro-esophageal reflux disease without esophagitis: Secondary | ICD-10-CM | POA: Diagnosis not present

## 2022-07-22 ENCOUNTER — Other Ambulatory Visit (HOSPITAL_COMMUNITY): Payer: Self-pay

## 2022-08-16 ENCOUNTER — Other Ambulatory Visit (HOSPITAL_COMMUNITY): Payer: Self-pay

## 2022-08-16 MED ORDER — WEGOVY 1 MG/0.5ML ~~LOC~~ SOAJ
1.0000 mg | SUBCUTANEOUS | 2 refills | Status: DC
  Filled 2022-08-16 – 2022-08-26 (×2): qty 2, 28d supply, fill #0
  Filled 2022-09-22: qty 2, 28d supply, fill #1
  Filled 2022-10-19: qty 2, 28d supply, fill #2

## 2022-08-26 ENCOUNTER — Other Ambulatory Visit: Payer: Self-pay

## 2022-09-23 ENCOUNTER — Other Ambulatory Visit: Payer: Self-pay

## 2022-10-19 ENCOUNTER — Other Ambulatory Visit (HOSPITAL_COMMUNITY): Payer: Self-pay

## 2022-10-21 ENCOUNTER — Other Ambulatory Visit: Payer: Self-pay

## 2022-11-17 ENCOUNTER — Other Ambulatory Visit (HOSPITAL_COMMUNITY): Payer: Self-pay

## 2022-11-18 ENCOUNTER — Other Ambulatory Visit (HOSPITAL_COMMUNITY): Payer: Self-pay

## 2022-11-18 MED ORDER — WEGOVY 1 MG/0.5ML ~~LOC~~ SOAJ
1.0000 mg | SUBCUTANEOUS | 2 refills | Status: AC
  Filled 2022-11-18: qty 2, 28d supply, fill #0
  Filled 2022-12-15: qty 2, 28d supply, fill #1

## 2022-11-19 ENCOUNTER — Other Ambulatory Visit: Payer: Self-pay

## 2022-12-15 ENCOUNTER — Other Ambulatory Visit (HOSPITAL_COMMUNITY): Payer: Self-pay

## 2022-12-27 ENCOUNTER — Other Ambulatory Visit: Payer: Self-pay

## 2022-12-30 DIAGNOSIS — E785 Hyperlipidemia, unspecified: Secondary | ICD-10-CM | POA: Diagnosis not present

## 2022-12-30 DIAGNOSIS — F5221 Male erectile disorder: Secondary | ICD-10-CM | POA: Diagnosis not present

## 2022-12-30 DIAGNOSIS — E0590 Thyrotoxicosis, unspecified without thyrotoxic crisis or storm: Secondary | ICD-10-CM | POA: Diagnosis not present

## 2022-12-30 DIAGNOSIS — I10 Essential (primary) hypertension: Secondary | ICD-10-CM | POA: Diagnosis not present

## 2023-01-06 DIAGNOSIS — K219 Gastro-esophageal reflux disease without esophagitis: Secondary | ICD-10-CM | POA: Diagnosis not present

## 2023-01-06 DIAGNOSIS — B009 Herpesviral infection, unspecified: Secondary | ICD-10-CM | POA: Diagnosis not present

## 2023-01-06 DIAGNOSIS — I10 Essential (primary) hypertension: Secondary | ICD-10-CM | POA: Diagnosis not present

## 2023-01-06 DIAGNOSIS — Z23 Encounter for immunization: Secondary | ICD-10-CM | POA: Diagnosis not present

## 2023-01-06 DIAGNOSIS — Z0000 Encounter for general adult medical examination without abnormal findings: Secondary | ICD-10-CM | POA: Diagnosis not present

## 2023-01-07 ENCOUNTER — Encounter (HOSPITAL_COMMUNITY): Payer: Self-pay | Admitting: Pharmacist

## 2023-01-07 ENCOUNTER — Other Ambulatory Visit (HOSPITAL_COMMUNITY): Payer: Self-pay

## 2023-01-07 MED ORDER — WEGOVY 1.7 MG/0.75ML ~~LOC~~ SOAJ
1.7000 mg | SUBCUTANEOUS | 2 refills | Status: DC
Start: 2023-01-06 — End: 2023-04-08
  Filled 2023-01-07: qty 3, 28d supply, fill #0
  Filled 2023-02-01: qty 3, 28d supply, fill #1
  Filled 2023-03-01 – 2023-03-07 (×2): qty 3, 28d supply, fill #2

## 2023-01-07 MED ORDER — JATENZO 237 MG PO CAPS
237.0000 mg | ORAL_CAPSULE | Freq: Two times a day (BID) | ORAL | 2 refills | Status: DC
Start: 2023-01-06 — End: 2023-02-24
  Filled 2023-01-07: qty 60, 30d supply, fill #0

## 2023-01-08 ENCOUNTER — Other Ambulatory Visit: Payer: Self-pay

## 2023-01-08 ENCOUNTER — Other Ambulatory Visit (HOSPITAL_COMMUNITY): Payer: Self-pay

## 2023-01-09 ENCOUNTER — Other Ambulatory Visit: Payer: Self-pay

## 2023-01-21 ENCOUNTER — Other Ambulatory Visit (HOSPITAL_COMMUNITY): Payer: Self-pay

## 2023-01-22 ENCOUNTER — Other Ambulatory Visit (HOSPITAL_COMMUNITY): Payer: Self-pay

## 2023-01-22 ENCOUNTER — Other Ambulatory Visit: Payer: Self-pay

## 2023-01-22 MED ORDER — ALBUTEROL SULFATE HFA 108 (90 BASE) MCG/ACT IN AERS
2.0000 | INHALATION_SPRAY | RESPIRATORY_TRACT | 5 refills | Status: AC
  Filled 2023-01-22: qty 6.7, 30d supply, fill #0
  Filled 2023-03-01 – 2023-03-06 (×2): qty 6.7, 30d supply, fill #1
  Filled 2023-04-06: qty 6.7, 25d supply, fill #2
  Filled 2023-09-21: qty 6.7, 16d supply, fill #3
  Filled 2023-12-16: qty 6.7, 16d supply, fill #4

## 2023-02-03 ENCOUNTER — Other Ambulatory Visit: Payer: Self-pay

## 2023-02-03 ENCOUNTER — Encounter (HOSPITAL_COMMUNITY): Payer: Self-pay

## 2023-02-05 ENCOUNTER — Other Ambulatory Visit (HOSPITAL_COMMUNITY): Payer: Self-pay

## 2023-02-20 ENCOUNTER — Other Ambulatory Visit (HOSPITAL_COMMUNITY): Payer: Self-pay

## 2023-02-25 ENCOUNTER — Encounter (HOSPITAL_COMMUNITY): Payer: Self-pay

## 2023-02-25 ENCOUNTER — Other Ambulatory Visit (HOSPITAL_COMMUNITY): Payer: Self-pay

## 2023-02-25 MED ORDER — TESTOSTERONE CYPIONATE 200 MG/ML IM SOLN
200.0000 mg | INTRAMUSCULAR | 1 refills | Status: AC
Start: 2023-02-24 — End: ?
  Filled 2023-02-25: qty 7, 90d supply, fill #0
  Filled 2023-04-25: qty 2, 28d supply, fill #0

## 2023-02-26 ENCOUNTER — Other Ambulatory Visit (HOSPITAL_COMMUNITY): Payer: Self-pay

## 2023-02-26 MED ORDER — TESTOSTERONE 20.25 MG/ACT (1.62%) TD GEL
4.0000 | Freq: Every day | TRANSDERMAL | 1 refills | Status: AC
Start: 2023-02-25 — End: ?
  Filled 2023-02-26: qty 75, 30d supply, fill #0
  Filled 2023-04-18: qty 75, 30d supply, fill #1

## 2023-03-03 ENCOUNTER — Other Ambulatory Visit (HOSPITAL_COMMUNITY): Payer: Self-pay

## 2023-03-06 ENCOUNTER — Other Ambulatory Visit (HOSPITAL_COMMUNITY): Payer: Self-pay

## 2023-03-07 ENCOUNTER — Other Ambulatory Visit (HOSPITAL_COMMUNITY): Payer: Self-pay

## 2023-03-11 ENCOUNTER — Other Ambulatory Visit (HOSPITAL_COMMUNITY): Payer: Self-pay

## 2023-03-17 ENCOUNTER — Other Ambulatory Visit (HOSPITAL_COMMUNITY): Payer: Self-pay

## 2023-04-06 ENCOUNTER — Other Ambulatory Visit (HOSPITAL_COMMUNITY): Payer: Self-pay

## 2023-04-07 ENCOUNTER — Other Ambulatory Visit: Payer: Self-pay

## 2023-04-08 ENCOUNTER — Other Ambulatory Visit (HOSPITAL_COMMUNITY): Payer: Self-pay

## 2023-04-08 ENCOUNTER — Other Ambulatory Visit: Payer: Self-pay

## 2023-04-08 MED ORDER — WEGOVY 1.7 MG/0.75ML ~~LOC~~ SOAJ
1.7000 mg | SUBCUTANEOUS | 2 refills | Status: AC
  Filled 2023-04-08 – 2023-04-25 (×3): qty 3, 28d supply, fill #0

## 2023-04-09 ENCOUNTER — Other Ambulatory Visit: Payer: Self-pay

## 2023-04-12 ENCOUNTER — Other Ambulatory Visit (HOSPITAL_COMMUNITY): Payer: Self-pay

## 2023-04-15 ENCOUNTER — Other Ambulatory Visit: Payer: Self-pay

## 2023-04-18 ENCOUNTER — Other Ambulatory Visit (HOSPITAL_COMMUNITY): Payer: Self-pay

## 2023-04-18 ENCOUNTER — Other Ambulatory Visit: Payer: Self-pay

## 2023-04-25 ENCOUNTER — Other Ambulatory Visit (HOSPITAL_COMMUNITY): Payer: Self-pay

## 2023-04-25 MED ORDER — WEGOVY 1.7 MG/0.75ML ~~LOC~~ SOAJ
1.7000 mg | SUBCUTANEOUS | 2 refills | Status: AC
Start: 2023-04-25 — End: ?
  Filled 2023-04-25: qty 3, 28d supply, fill #0

## 2023-05-05 ENCOUNTER — Other Ambulatory Visit: Payer: Self-pay

## 2023-06-30 DIAGNOSIS — Z125 Encounter for screening for malignant neoplasm of prostate: Secondary | ICD-10-CM | POA: Diagnosis not present

## 2023-06-30 DIAGNOSIS — R7301 Impaired fasting glucose: Secondary | ICD-10-CM | POA: Diagnosis not present

## 2023-06-30 DIAGNOSIS — E785 Hyperlipidemia, unspecified: Secondary | ICD-10-CM | POA: Diagnosis not present

## 2023-06-30 DIAGNOSIS — R891 Abnormal level of hormones in specimens from other organs, systems and tissues: Secondary | ICD-10-CM | POA: Diagnosis not present

## 2023-07-07 ENCOUNTER — Other Ambulatory Visit: Payer: Self-pay

## 2023-07-07 ENCOUNTER — Other Ambulatory Visit (HOSPITAL_COMMUNITY): Payer: Self-pay

## 2023-07-07 DIAGNOSIS — E039 Hypothyroidism, unspecified: Secondary | ICD-10-CM | POA: Diagnosis not present

## 2023-07-07 DIAGNOSIS — E785 Hyperlipidemia, unspecified: Secondary | ICD-10-CM | POA: Diagnosis not present

## 2023-07-07 DIAGNOSIS — I10 Essential (primary) hypertension: Secondary | ICD-10-CM | POA: Diagnosis not present

## 2023-07-07 DIAGNOSIS — B009 Herpesviral infection, unspecified: Secondary | ICD-10-CM | POA: Diagnosis not present

## 2023-07-07 DIAGNOSIS — K219 Gastro-esophageal reflux disease without esophagitis: Secondary | ICD-10-CM | POA: Diagnosis not present

## 2023-07-07 MED ORDER — WEGOVY 1.7 MG/0.75ML ~~LOC~~ SOAJ
1.7000 mg | SUBCUTANEOUS | 2 refills | Status: DC
Start: 2023-07-07 — End: 2023-11-10
  Filled 2023-07-07: qty 3, 28d supply, fill #0

## 2023-07-08 ENCOUNTER — Other Ambulatory Visit: Payer: Self-pay

## 2023-09-21 ENCOUNTER — Telehealth: Admitting: Family

## 2023-09-21 DIAGNOSIS — J029 Acute pharyngitis, unspecified: Secondary | ICD-10-CM

## 2023-09-21 MED ORDER — AMOXICILLIN 500 MG PO CAPS: 500.0000 mg | ORAL_CAPSULE | Freq: Two times a day (BID) | ORAL | 0 refills | Status: AC

## 2023-09-21 NOTE — Progress Notes (Signed)
E-Visit for Sore Throat - Strep Symptoms ? ?We are sorry that you are not feeling well.  Here is how we plan to help! ? ?Based on what you have shared with me it is likely that you have strep pharyngitis.  Strep pharyngitis is inflammation and infection in the back of the throat.  This is an infection cause by bacteria and is treated with antibiotics.  I have prescribed Amoxicillin 500 mg twice a day for 10 days. For throat pain, we recommend over the counter oral pain relief medications such as acetaminophen or aspirin, or anti-inflammatory medications such as ibuprofen or naproxen sodium. Topical treatments such as oral throat lozenges or sprays may be used as needed. Strep infections are not as easily transmitted as other respiratory infections, however we still recommend that you avoid close contact with loved ones, especially the very young and elderly.  Remember to wash your hands thoroughly throughout the day as this is the number one way to prevent the spread of infection and wipe down door knobs and counters with disinfectant. ? ? ?Home Care: ?Only take medications as instructed by your medical team. ?Complete the entire course of an antibiotic. ?Do not take these medications with alcohol. ?A steam or ultrasonic humidifier can help congestion.  You can place a towel over your head and breathe in the steam from hot water coming from a faucet. ?Avoid close contacts especially the very young and the elderly. ?Cover your mouth when you cough or sneeze. ?Always remember to wash your hands. ? ?Get Help Right Away If: ?You develop worsening fever or sinus pain. ?You develop a severe head ache or visual changes. ?Your symptoms persist after you have completed your treatment plan. ? ?Make sure you ?Understand these instructions. ?Will watch your condition. ?Will get help right away if you are not doing well or get worse. ? ? ?Thank you for choosing an e-visit. ? ?Your e-visit answers were reviewed by a board  certified advanced clinical practitioner to complete your personal care plan. Depending upon the condition, your plan could have included both over the counter or prescription medications. ? ?Please review your pharmacy choice. Make sure the pharmacy is open so you can pick up prescription now. If there is a problem, you may contact your provider through MyChart messaging and have the prescription routed to another pharmacy.  Your safety is important to us. If you have drug allergies check your prescription carefully.  ? ?For the next 24 hours you can use MyChart to ask questions about today's visit, request a non-urgent call back, or ask for a work or school excuse. ?You will get an email in the next two days asking about your experience. I hope that your e-visit has been valuable and will speed your recovery. ? ?Approximately 5 minutes was spent documenting and reviewing patient's chart.  ? ? ?

## 2023-09-22 ENCOUNTER — Other Ambulatory Visit (HOSPITAL_COMMUNITY): Payer: Self-pay

## 2023-11-10 DIAGNOSIS — R891 Abnormal level of hormones in specimens from other organs, systems and tissues: Secondary | ICD-10-CM | POA: Diagnosis not present

## 2023-11-10 DIAGNOSIS — I10 Essential (primary) hypertension: Secondary | ICD-10-CM | POA: Diagnosis not present

## 2023-11-10 DIAGNOSIS — E291 Testicular hypofunction: Secondary | ICD-10-CM | POA: Diagnosis not present

## 2023-12-16 ENCOUNTER — Other Ambulatory Visit (HOSPITAL_COMMUNITY): Payer: Self-pay

## 2024-01-19 DIAGNOSIS — E039 Hypothyroidism, unspecified: Secondary | ICD-10-CM | POA: Diagnosis not present

## 2024-01-19 DIAGNOSIS — R7301 Impaired fasting glucose: Secondary | ICD-10-CM | POA: Diagnosis not present

## 2024-01-19 DIAGNOSIS — E785 Hyperlipidemia, unspecified: Secondary | ICD-10-CM | POA: Diagnosis not present

## 2024-01-19 DIAGNOSIS — R891 Abnormal level of hormones in specimens from other organs, systems and tissues: Secondary | ICD-10-CM | POA: Diagnosis not present

## 2024-01-26 DIAGNOSIS — Z23 Encounter for immunization: Secondary | ICD-10-CM | POA: Diagnosis not present

## 2024-01-26 DIAGNOSIS — E291 Testicular hypofunction: Secondary | ICD-10-CM | POA: Diagnosis not present

## 2024-01-26 DIAGNOSIS — E66811 Obesity, class 1: Secondary | ICD-10-CM | POA: Diagnosis not present

## 2024-01-26 DIAGNOSIS — Z1331 Encounter for screening for depression: Secondary | ICD-10-CM | POA: Diagnosis not present

## 2024-01-26 DIAGNOSIS — E039 Hypothyroidism, unspecified: Secondary | ICD-10-CM | POA: Diagnosis not present

## 2024-01-26 DIAGNOSIS — E785 Hyperlipidemia, unspecified: Secondary | ICD-10-CM | POA: Diagnosis not present

## 2024-01-26 DIAGNOSIS — R891 Abnormal level of hormones in specimens from other organs, systems and tissues: Secondary | ICD-10-CM | POA: Diagnosis not present

## 2024-01-26 DIAGNOSIS — Z0001 Encounter for general adult medical examination with abnormal findings: Secondary | ICD-10-CM | POA: Diagnosis not present

## 2024-01-26 DIAGNOSIS — M170 Bilateral primary osteoarthritis of knee: Secondary | ICD-10-CM | POA: Diagnosis not present

## 2024-01-26 DIAGNOSIS — I10 Essential (primary) hypertension: Secondary | ICD-10-CM | POA: Diagnosis not present

## 2024-04-19 DIAGNOSIS — R891 Abnormal level of hormones in specimens from other organs, systems and tissues: Secondary | ICD-10-CM | POA: Diagnosis not present
